# Patient Record
Sex: Female | Born: 1990 | Race: White | Hispanic: No | Marital: Married | Smoking: Never smoker
Health system: Southern US, Community
[De-identification: ages and names within clinical notes are randomized; demographics above are authoritative.]

## PROBLEM LIST (undated history)

## (undated) DIAGNOSIS — O24419 Gestational diabetes mellitus in pregnancy, unspecified control: Secondary | ICD-10-CM

## (undated) HISTORY — PX: NO PAST SURGERIES: SHX2092

## (undated) HISTORY — DX: Gestational diabetes mellitus in pregnancy, unspecified control: O24.419

---

## 2015-10-29 DIAGNOSIS — F900 Attention-deficit hyperactivity disorder, predominantly inattentive type: Secondary | ICD-10-CM | POA: Insufficient documentation

## 2016-01-24 DIAGNOSIS — Z79899 Other long term (current) drug therapy: Secondary | ICD-10-CM | POA: Insufficient documentation

## 2019-11-10 NOTE — L&D Delivery Note (Signed)
Delivery Note At 9:56 AM a viable female was delivered via Vaginal, Spontaneous (Presentation: LOA).  APGAR: 9, 10; weight pending.   Placenta status: Spontaneous, Intact.  Cord: 3 vessels with the following complications: None.  Cord pH: n/a  Anesthesia: Epidural Episiotomy: None Lacerations: Bilateral Periurethral Suture Repair: 3.0 vicryl Est. Blood Loss (mL):  200  Mom to postpartum.  Baby to Couplet care / Skin to Skin.  Mitchel Honour 09/19/2020, 10:17 AM

## 2020-02-23 LAB — OB RESULTS CONSOLE HEPATITIS B SURFACE ANTIGEN: Hepatitis B Surface Ag: NEGATIVE

## 2020-02-23 LAB — OB RESULTS CONSOLE ABO/RH: RH Type: POSITIVE

## 2020-02-23 LAB — OB RESULTS CONSOLE ANTIBODY SCREEN: Antibody Screen: NEGATIVE

## 2020-02-23 LAB — OB RESULTS CONSOLE RUBELLA ANTIBODY, IGM: Rubella: IMMUNE

## 2020-02-23 LAB — OB RESULTS CONSOLE GC/CHLAMYDIA
Chlamydia: NEGATIVE
Gonorrhea: NEGATIVE

## 2020-02-23 LAB — OB RESULTS CONSOLE HIV ANTIBODY (ROUTINE TESTING): HIV: NONREACTIVE

## 2020-02-23 LAB — OB RESULTS CONSOLE RPR: RPR: NONREACTIVE

## 2020-03-05 ENCOUNTER — Inpatient Hospital Stay (HOSPITAL_COMMUNITY): Admission: AD | Admit: 2020-03-05 | Payer: Self-pay | Source: Home / Self Care | Admitting: Obstetrics and Gynecology

## 2020-07-08 ENCOUNTER — Ambulatory Visit: Payer: Self-pay

## 2020-07-11 ENCOUNTER — Inpatient Hospital Stay (HOSPITAL_COMMUNITY)
Admission: AD | Admit: 2020-07-11 | Discharge: 2020-07-12 | Disposition: A | Discharge status: Home / Self Care | Payer: BC Managed Care – PPO | Attending: Obstetrics and Gynecology | Admitting: Obstetrics and Gynecology

## 2020-07-11 ENCOUNTER — Inpatient Hospital Stay (HOSPITAL_COMMUNITY): Payer: BC Managed Care – PPO

## 2020-07-11 ENCOUNTER — Other Ambulatory Visit: Payer: Self-pay

## 2020-07-11 ENCOUNTER — Encounter (HOSPITAL_COMMUNITY): Payer: Self-pay | Admitting: Obstetrics and Gynecology

## 2020-07-11 DIAGNOSIS — R0981 Nasal congestion: Secondary | ICD-10-CM

## 2020-07-11 DIAGNOSIS — O98513 Other viral diseases complicating pregnancy, third trimester: Secondary | ICD-10-CM | POA: Insufficient documentation

## 2020-07-11 DIAGNOSIS — Z880 Allergy status to penicillin: Secondary | ICD-10-CM | POA: Diagnosis not present

## 2020-07-11 DIAGNOSIS — O26893 Other specified pregnancy related conditions, third trimester: Secondary | ICD-10-CM | POA: Diagnosis not present

## 2020-07-11 DIAGNOSIS — O99891 Other specified diseases and conditions complicating pregnancy: Secondary | ICD-10-CM | POA: Diagnosis not present

## 2020-07-11 DIAGNOSIS — U071 COVID-19: Secondary | ICD-10-CM | POA: Diagnosis present

## 2020-07-11 DIAGNOSIS — R0789 Other chest pain: Secondary | ICD-10-CM | POA: Insufficient documentation

## 2020-07-11 DIAGNOSIS — Z3A29 29 weeks gestation of pregnancy: Secondary | ICD-10-CM | POA: Insufficient documentation

## 2020-07-11 DIAGNOSIS — R0602 Shortness of breath: Secondary | ICD-10-CM | POA: Diagnosis not present

## 2020-07-11 MED ORDER — LACTATED RINGERS IV BOLUS
1000.0000 mL | Freq: Once | INTRAVENOUS | Status: AC
  Administered 2020-07-11: 1000 mL via INTRAVENOUS

## 2020-07-11 NOTE — MAU Note (Signed)
.  Natalie Barnes is a 29 y.o. at Unknown here in MAU reporting: she called her on call provider after finding out she tested positive for COVID. Pt reports the nurse instructed her to go to the ED. Pt is experiencing chest tightness, SOB, and beginning to feel congested behind her sinuses.  FHT: 145 external

## 2020-07-11 NOTE — MAU Note (Signed)
PT states she was having some chest pressure and took covid test which was positive. She called her doctor and was told to come in for chest xray. States otherwise she is fine

## 2020-07-12 ENCOUNTER — Encounter: Payer: Self-pay | Admitting: Registered"

## 2020-07-12 ENCOUNTER — Ambulatory Visit: Payer: Self-pay | Admitting: Registered"

## 2020-07-12 ENCOUNTER — Other Ambulatory Visit (HOSPITAL_COMMUNITY): Payer: Self-pay | Admitting: Nurse Practitioner

## 2020-07-12 ENCOUNTER — Encounter: Payer: BC Managed Care – PPO | Attending: Obstetrics and Gynecology | Admitting: Registered"

## 2020-07-12 DIAGNOSIS — O99891 Other specified diseases and conditions complicating pregnancy: Secondary | ICD-10-CM

## 2020-07-12 DIAGNOSIS — U071 COVID-19: Secondary | ICD-10-CM

## 2020-07-12 DIAGNOSIS — R0602 Shortness of breath: Secondary | ICD-10-CM

## 2020-07-12 DIAGNOSIS — O24419 Gestational diabetes mellitus in pregnancy, unspecified control: Secondary | ICD-10-CM

## 2020-07-12 DIAGNOSIS — R0981 Nasal congestion: Secondary | ICD-10-CM

## 2020-07-12 DIAGNOSIS — Z3A29 29 weeks gestation of pregnancy: Secondary | ICD-10-CM

## 2020-07-12 DIAGNOSIS — R0789 Other chest pain: Secondary | ICD-10-CM

## 2020-07-12 DIAGNOSIS — O98513 Other viral diseases complicating pregnancy, third trimester: Secondary | ICD-10-CM | POA: Diagnosis present

## 2020-07-12 LAB — COMPREHENSIVE METABOLIC PANEL
ALT: 17 U/L (ref 0–44)
AST: 16 U/L (ref 15–41)
Albumin: 3.3 g/dL — ABNORMAL LOW (ref 3.5–5.0)
Alkaline Phosphatase: 61 U/L (ref 38–126)
Anion gap: 10 (ref 5–15)
BUN: 9 mg/dL (ref 6–20)
CO2: 21 mmol/L — ABNORMAL LOW (ref 22–32)
Calcium: 9.3 mg/dL (ref 8.9–10.3)
Chloride: 105 mmol/L (ref 98–111)
Creatinine, Ser: 0.54 mg/dL (ref 0.44–1.00)
GFR calc Af Amer: >60 mL/min (ref >60)
GFR calc non Af Amer: >60 mL/min (ref >60)
Glucose, Bld: 82 mg/dL (ref 70–99)
Potassium: 3.4 mmol/L — ABNORMAL LOW (ref 3.5–5.1)
Sodium: 136 mmol/L (ref 135–145)
Total Bilirubin: 0.6 mg/dL (ref 0.3–1.2)
Total Protein: 6.4 g/dL — ABNORMAL LOW (ref 6.5–8.1)

## 2020-07-12 LAB — CBC WITH DIFFERENTIAL/PLATELET
Abs Immature Granulocytes: 0.06 10*3/uL (ref 0.00–0.07)
Basophils Absolute: 0 10*3/uL (ref 0.0–0.1)
Basophils Relative: 0 %
Eosinophils Absolute: 0.1 10*3/uL (ref 0.0–0.5)
Eosinophils Relative: 1 %
HCT: 37.2 % (ref 36.0–46.0)
Hemoglobin: 12.2 g/dL (ref 12.0–15.0)
Immature Granulocytes: 1 %
Lymphocytes Relative: 23 %
Lymphs Abs: 2.1 10*3/uL (ref 0.7–4.0)
MCH: 27.4 pg (ref 26.0–34.0)
MCHC: 32.8 g/dL (ref 30.0–36.0)
MCV: 83.4 fL (ref 80.0–100.0)
Monocytes Absolute: 0.7 10*3/uL (ref 0.1–1.0)
Monocytes Relative: 7 %
Neutro Abs: 6.5 10*3/uL (ref 1.7–7.7)
Neutrophils Relative %: 68 %
Platelets: 303 10*3/uL (ref 150–400)
RBC: 4.46 MIL/uL (ref 3.87–5.11)
RDW: 12.9 % (ref 11.5–15.5)
WBC: 9.4 10*3/uL (ref 4.0–10.5)
nRBC: 0 % (ref 0.0–0.2)

## 2020-07-12 LAB — TROPONIN I (HIGH SENSITIVITY): Troponin I (High Sensitivity): 3 ng/L (ref <18)

## 2020-07-12 LAB — BRAIN NATRIURETIC PEPTIDE: B Natriuretic Peptide: 33.2 pg/mL (ref 0.0–100.0)

## 2020-07-12 MED ORDER — GUAIFENESIN ER 600 MG PO TB12: 600.0000 mg | ORAL_TABLET | Freq: Two times a day (BID) | ORAL | 1 refills | Status: DC

## 2020-07-12 NOTE — Progress Notes (Signed)
Patient was seen on 07/12/20 for Gestational Diabetes self-management education at the [facility] [place].  The following learning objectives were met by the patient during this course:   States the definition of Gestational Diabetes  States why dietary management is important in controlling blood glucose  Describes the effects each nutrient has on blood glucose levels  Demonstrates ability to create a balanced meal plan  Demonstrates carbohydrate counting   States when to check blood glucose levels  Voiced proper blood glucose monitoring techniques, reviewed video instructions  States the effect of stress and exercise on blood glucose levels  States the importance of limiting caffeine and abstaining from alcohol and smoking  Blood glucose monitor given: none - virtual visit   Patient instructed to monitor glucose levels: FBS: 60 - <95; 1 hour: <140; 2 hour: <120  Patient received handouts:  Nutrition Diabetes and Pregnancy, including carb counting list  Patient will be seen for follow-up as needed.

## 2020-07-12 NOTE — Discharge Instructions (Signed)
10 Things You Can Do to Manage Your COVID-19 Symptoms at Home If you have possible or confirmed COVID-19: 1. Stay home from work and school. And stay away from other public places. If you must go out, avoid using any kind of public transportation, ridesharing, or taxis. 2. Monitor your symptoms carefully. If your symptoms get worse, call your healthcare provider immediately. 3. Get rest and stay hydrated. 4. If you have a medical appointment, call the healthcare provider ahead of time and tell them that you have or may have COVID-19. 5. For medical emergencies, call 911 and notify the dispatch personnel that you have or may have COVID-19. 6. Cover your cough and sneezes with a tissue or use the inside of your elbow. 7. Wash your hands often with soap and water for at least 20 seconds or clean your hands with an alcohol-based hand sanitizer that contains at least 60% alcohol. 8. As much as possible, stay in a specific room and away from other people in your home. Also, you should use a separate bathroom, if available. If you need to be around other people in or outside of the home, wear a mask. 9. Avoid sharing personal items with other people in your household, like dishes, towels, and bedding. 10. Clean all surfaces that are touched often, like counters, tabletops, and doorknobs. Use household cleaning sprays or wipes according to the label instructions. [email or web address] 05/10/2019 This information is not intended to replace advice given to you by your health care provider. Make sure you discuss any questions you have with your health care provider. Document Revised: 10/12/2019 Document Reviewed: 10/12/2019 Elsevier Patient Education  [place].   COVID-19 Frequently Asked Questions COVID-19 (coronavirus disease) is an infection that is caused by a large family of viruses. Some viruses cause illness in people and others cause illness in animals like camels, cats, and bats. In some  cases, the viruses that cause illness in animals can spread to humans. Where did the coronavirus come from? In December 2019, [place] told the [facility]) of several cases of lung disease (human respiratory illness). These cases were linked to an open seafood and livestock market in the city of [place]. The link to the seafood and livestock market suggests that the virus may have spread from animals to humans. However, since that first outbreak in December, the virus has also been shown to spread from person to person. What is the name of the disease and the virus? Disease name Early on, this disease was called novel coronavirus. This is because scientists determined that the disease was caused by a new (novel) respiratory virus. The World Health Organization [facility] has now named the disease COVID-19, or coronavirus disease. Virus name The virus that causes the disease is called severe acute respiratory syndrome coronavirus 2 (SARS-CoV-2). More information on disease and virus naming World Health Organization [facility]): www.who.int/emergencies/diseases/novel-coronavirus-2019/technical-guidance/naming-the-coronavirus-disease-(covid-2019)-and-the-virus-that-causes-it Who is at risk for complications from coronavirus disease? Some people may be at higher risk for complications from coronavirus disease. This includes older adults and people who have chronic diseases, such as heart disease, diabetes, and lung disease. If you are at higher risk for complications, take these extra precautions:  Stay home as much as possible.  Avoid social gatherings and travel.  Avoid close contact with others. Stay at least 6 ft (2 m) away from others, if possible.  Wash your hands often with soap and water for at least 20 seconds.  Avoid touching your face, mouth, nose, or eyes.  Keep supplies on hand at home, such as food, medicine, and cleaning supplies.  If you must go out in public, wear a cloth  face covering or face mask. Make sure your mask covers your nose and mouth. How does coronavirus disease spread? The virus that causes coronavirus disease spreads easily from person to person (is contagious). You may catch the virus by:  Breathing in droplets from an infected person. Droplets can be spread by a person breathing, speaking, singing, coughing, or sneezing.  Touching something, like a table or a doorknob, that was exposed to the virus (contaminated) and then touching your mouth, nose, or eyes. Can I get the virus from touching surfaces or objects? There is still a lot that we do not know about the virus that causes coronavirus disease. Scientists are basing a lot of information on what they know about similar viruses, such as:  Viruses cannot generally survive on surfaces for long. They need a human body (host) to survive.  It is more likely that the virus is spread by close contact with people who are sick (direct contact), such as through: ? Shaking hands or hugging. ? Breathing in respiratory droplets that travel through the air. Droplets can be spread by a person breathing, speaking, singing, coughing, or sneezing.  It is less likely that the virus is spread when a person touches a surface or object that has the virus on it (indirect contact). The virus may be able to enter the body if the person touches a surface or object and then touches his or her face, eyes, nose, or mouth. Can a person spread the virus without having symptoms of the disease? It may be possible for the virus to spread before a person has symptoms of the disease, but this is most likely not the main way the virus is spreading. It is more likely for the virus to spread by being in close contact with people who are sick and breathing in the respiratory droplets spread by a person breathing, speaking, singing, coughing, or sneezing. What are the symptoms of coronavirus disease? Symptoms vary from person to  person and can range from mild to severe. Symptoms may include:  Fever or chills.  Cough.  Difficulty breathing or feeling short of breath.  Headaches, body aches, or muscle aches.  Runny or stuffy (congested) nose.  Sore throat.  New loss of taste or smell.  Nausea, vomiting, or diarrhea. These symptoms can appear anywhere from 2 to 14 days after you have been exposed to the virus. Some people may not have any symptoms. If you develop symptoms, call your health care provider. People with severe symptoms may need hospital care. Should I be tested for this virus? Your health care provider will decide whether to test you based on your symptoms, history of exposure, and your risk factors. How does a health care provider test for this virus? Health care providers will collect samples to send for testing. Samples may include:  Taking a swab of fluid from the back of your nose and throat, your nose, or your throat.  Taking fluid from the lungs by having you cough up mucus (sputum) into a sterile cup.  Taking a blood sample. Is there a treatment or vaccine for this virus? Currently, there is no vaccine to prevent coronavirus disease. Also, there are no medicines like antibiotics or antivirals to treat the virus. A person who becomes sick is given supportive care, which means rest and fluids. A person may also  relieve his or her symptoms by using over-the-counter medicines that treat sneezing, coughing, and runny nose. These are the same medicines that a person takes for the common cold. If you develop symptoms, call your health care provider. People with severe symptoms may need hospital care. What can I do to protect myself and my family from this virus?     You can protect yourself and your family by taking the same actions that you would take to prevent the spread of other viruses. Take the following actions:  Wash your hands often with soap and water for at least 20 seconds. If soap  and water are not available, use alcohol-based hand sanitizer.  Avoid touching your face, mouth, nose, or eyes.  Cough or sneeze into a tissue, sleeve, or elbow. Do not cough or sneeze into your hand or the air. ? If you cough or sneeze into a tissue, throw it away immediately and wash your hands.  Disinfect objects and surfaces that you frequently touch every day.  Stay away from people who are sick.  Avoid going out in public, follow guidance from your state and local health authorities.  Avoid crowded indoor spaces. Stay at least 6 ft (2 m) away from others.  If you must go out in public, wear a cloth face covering or face mask. Make sure your mask covers your nose and mouth.  Stay home if you are sick, except to get medical care. Call your health care provider before you get medical care. Your health care provider will tell you how long to stay home.  Make sure your vaccines are up to date. Ask your health care provider what vaccines you need. What should I do if I need to travel? Follow travel recommendations from your local health authority, the CDC, and WHO. Travel information and advice  Centers for Disease Control and Prevention (CDC): www.cdc.gov/coronavirus/2019-ncov/travelers/index.html  World Health Organization (WHO): www.who.int/emergencies/diseases/novel-coronavirus-2019/travel-advice Know the risks and take action to protect your health  You are at higher risk of getting coronavirus disease if you are traveling to areas with an outbreak or if you are exposed to travelers from areas with an outbreak.  Wash your hands often and practice good hygiene to lower the risk of catching or spreading the virus. What should I do if I am sick? General instructions to stop the spread of infection  Wash your hands often with soap and water for at least 20 seconds. If soap and water are not available, use alcohol-based hand sanitizer.  Cough or sneeze into a tissue, sleeve, or  elbow. Do not cough or sneeze into your hand or the air.  If you cough or sneeze into a tissue, throw it away immediately and wash your hands.  Stay home unless you must get medical care. Call your health care provider or local health authority before you get medical care.  Avoid public areas. Do not take public transportation, if possible.  If you can, wear a mask if you must go out of the house or if you are in close contact with someone who is not sick. Make sure your mask covers your nose and mouth. Keep your home clean  Disinfect objects and surfaces that are frequently touched every day. This may include: ? Counters and tables. ? Doorknobs and light switches. ? Sinks and faucets. ? Electronics such as phones, remote controls, keyboards, computers, and tablets.  Wash dishes in hot, soapy water or use a dishwasher. Air-dry your dishes.  Wash laundry in   hot water. Prevent infecting other household members  Let healthy household members care for children and pets, if possible. If you have to care for children or pets, wash your hands often and wear a mask.  Sleep in a different bedroom or bed, if possible.  Do not share personal items, such as razors, toothbrushes, deodorant, combs, brushes, towels, and washcloths. Where to find more information Centers for Disease Control and Prevention (CDC)  Information and news updates: CardRetirement.cz World Health Organization [facility])  Information and news updates: AffordableSalon.es  Coronavirus health topic: https://thompson-craig.com/  Questions and answers on COVID-19: kruiseway.com  Global tracker: who.sprinklr.com American Academy of Pediatrics (AAP)  Information for families: www.healthychildren.org/English/health-issues/conditions/chest-lungs/Pages/2019-Novel-Coronavirus.aspx The coronavirus situation is changing rapidly. Check  your local health authority website or the CDC and [facility] websites for updates and news. When should I contact a health care provider?  Contact your health care provider if you have symptoms of an infection, such as fever or cough, and you: ? Have been near anyone who is known to have coronavirus disease. ? Have come into contact with a person who is suspected to have coronavirus disease. ? Have traveled to an area where there is an outbreak of COVID-19. When should I get emergency medical care?  Get help right away by calling your local emergency services (911 in the [place] if you have: ? Trouble breathing. ? Pain or pressure in your chest. ? Confusion. ? Blue-tinged lips and fingernails. ? Difficulty waking from sleep. ? Symptoms that get worse. Let the emergency medical personnel know if you think you have coronavirus disease. Summary  A new respiratory virus is spreading from person to person and causing COVID-19 (coronavirus disease).  The virus that causes COVID-19 appears to spread easily. It spreads from one person to another through droplets from breathing, speaking, singing, coughing, or sneezing.  Older adults and those with chronic diseases are at higher risk of disease. If you are at higher risk for complications, take extra precautions.  There is currently no vaccine to prevent coronavirus disease. There are no medicines, such as antibiotics or antivirals, to treat the virus.  You can protect yourself and your family by washing your hands often, avoiding touching your face, and covering your coughs and sneezes. This information is not intended to replace advice given to you by your health care provider. Make sure you discuss any questions you have with your health care provider. Document Revised: 08/25/2019 Document Reviewed: 02/21/2019 Elsevier Patient Education  2020 Elsevier Inc.  Pregnancy and COVID-19 Coronavirus disease, also called COVID-19, is an infection of the  lungs and airways (respiratory tract). It is unclear at this time if pregnancy makes it more likely for you to get COVID-19, or what effects the infection may have on your unborn baby. However, pregnancy causes changes to your heart, lungs, and your body's disease-fighting system (immune system). Some of these changes make it more likely for you to get sick and have more serious illness. Therefore, it is important for you to take precautions in order to protect yourself and your unborn baby. There have been studies showing that obesity and diabetes may put you at higher risk for serious illness. If you are pregnant and are obese or have diabetes, you should take extra precautions to protect yourself from the virus. Work with your health care team to develop a plan to protect yourself from all infections, including COVID-19. This is one way for you to stay healthy during your pregnancy and to keep your  baby healthy as well. How does this affect me? If you get COVID-19, there is a risk that you may:  Get a respiratory illness that can lead to pneumonia.  Give birth to your baby before 37 weeks of pregnancy (premature birth). If you have or may have COVID-19, your health care provider may recommend special precautions around your pregnancy. This may affect how you:  Receive care before delivery (prenatal care). How you visit your health care provider may change. Tests and scans may need to be performed differently.  Receive care during labor and delivery. This may affect your birth plan, including who may be with you during labor and delivery.  Receive care after you deliver your baby (postpartum care). You may stay longer in the hospital and in a special room.  Feed your baby after he or she is born. Pregnancy can be an especially stressful time because of the changes in your body and the preparation involved in becoming a parent. In addition, you may be feeling especially fearful, anxious, or  stressed because of COVID-19 and how it is affecting you. How does this affect my baby? It is not known whether a mother will transmit the virus to her unborn baby. There is a risk that if you get COVID-19:  The virus that causes COVID-19 can pass to your baby.  You may have premature birth. Your baby may require more medical care if this happens. What can I do to lower my risk?  There is no vaccine to help prevent COVID-19. However, there are actions that you can take to protect yourself and others from this virus. Cleaning and personal hygiene  Wash your hands often with soap and water for at least 20 seconds. If soap and water are not available, use alcohol-based hand sanitizer.  Avoid touching your mouth, face, eyes, or nose.  Clean and disinfect objects and surfaces that are frequently touched every day. These may include: ? Counters and tables. ? Doorknobs and light switches. ? Sinks and faucets. ? Electronics such as phones, remote controls, keyboards, computers, and tablets. Stay away from others  Stay away from people who are sick, if possible.  Avoid social gatherings and travel.  Stay home as much as possible. Follow these instructions: Breastfeeding It is not known if the virus that causes COVID-19 can pass through breast milk to your baby. You should make a plan for feeding your infant with your family and your health care team. If you have or may have COVID-19, your health care provider may recommend that you take precautions while breastfeeding, such as:  Washing your hands before feeding your baby.  Wearing a mask while feeding your baby.  Pumping or expressing breast milk to feed to your baby. If possible, ask someone in your household who is not sick to feed your baby the expressed breast milk. ? Wash your hands before touching pump parts. ? Wash and disinfect all pump parts after expressing milk. Follow the manufacturer's instructions to clean and disinfect  all pump parts. General instructions  If you think you have a COVID-19 infection, contact your health care provider right away. Tell your health care provider that you think you may have a COVID-19 infection.  Follow your health care provider's instructions on taking medicines. Some medicines may be unsafe to take during pregnancy.  Cover your mouth and nose by wearing a mask or other cloth covering over your face when you go out in public.  Find ways to manage stress.  These may include: ? Using relaxation techniques like meditation and deep breathing. ? Getting regular exercise. Most women can continue their usual exercise routine during pregnancy. Ask your health care provider what activities are safe for you. ? Seeking support from family, friends, or spiritual resources. If you cannot be together in person, you can still connect by phone calls, texts, video calls, or online messaging. ? Spending time doing relaxing activities that you enjoy, like listening to music or reading a good book.  Ask for help if you have counseling or nutritional needs during pregnancy. Your health care provider can offer advice or refer you to resources or specialists who can help you with various needs.  Keep all follow-up visits as told by your health care provider. This is important. Where to find more information Centers for Disease Control and Prevention (CDC): AffordableShare.com.brwww.cdc.gov/coronavirus/2019-ncov/ World Health Organization [facility](WHO): PokerPortraits.eswww.who.int/news-room/q-a-detail/q-a-on-covid-19-pregnancy-childbirth-and-breastfeeding [employer] (ACOG): BuyDucts.dkwww.acog.org/patient-resources/faqs/pregnancy/coronavirus-pregnancy-and-breastfeeding Questions to ask your health care team  What should I do if I have COVID-19 symptoms?  How will COVID-19 affect my prenatal care visits, tests and scans, labor and delivery, and postpartum care?  Should I plan to breastfeed my baby?  Where can I  find mental health resources?  Where can I find support if I have financial concerns? Contact a health care provider if:  You have signs and symptoms of infection, including a fever or cough. Tell your health care team that you think you may have a COVID-19 infection.  You have strong emotions, such as sadness or anxiety.  You feel unsafe in your home and need help finding a safe place to live.  You have bloody or watery vaginal discharge or vaginal bleeding. Get help right away if:  You have signs or symptoms of labor before 37 weeks of pregnancy. These include: ? Contractions that are 5 minutes or less apart, or that increase in frequency, intensity, or length. ? Sudden, sharp pain in the abdomen or in the lower back. ? A gush or trickle of fluid from your vagina.  You have signs of more serious illness such as: ? You have difficulty breathing. ? You have chest pain. ? You have a fever greater than 102F (39C) or higher that does not go away. ? You cannot drink fluids without vomiting. ? You feel extremely weak or you faint. These symptoms may represent a serious problem that is an emergency. Do not wait to see if the symptoms will go away. Get medical help right away. Call your local emergency services (911 in the [place] Do not drive yourself to the hospital. Summary  Coronavirus disease, also called COVID-19, is an infection of the lungs and airways (respiratory tract). It is unclear at this time if pregnancy makes you more susceptible to COVID-19 and what effects it may have on unborn babies.  It is important to take precautions to protect yourself and your developing baby. This includes washing your hands often, avoiding touching your mouth, face, eyes, or nose, avoiding social gatherings and travel, and staying away from people who are sick.  If you think you have a COVID-19 infection, contact your health care provider right away. Tell your health care provider that you think  you may have a COVID-19 infection.  If you have or may have COVID-19, your health care provider may recommend special precautions during your pregnancy, labor and delivery, and after your baby is born. This information is not intended to replace advice given to you by your health care  provider. Make sure you discuss any questions you have with your health care provider. Document Revised: 08/18/2019 Document Reviewed: 02/21/2019 Elsevier Patient Education  2020 [employer].

## 2020-07-12 NOTE — MAU Provider Note (Signed)
Chief Complaint:  Chest Pain (tightness) and Shortness of Breath   First Provider Initiated Contact with Patient 07/11/20 at 2330     HPI: Kaytlin Mom is a 29 y.o. G2P0 at 60w2dwho presents to maternity admissions reporting testing positive for Covid today (states urgent care referred her to a mobile testing unit).  States has some pressure/heaviness in chest which is what prompted her to get tested.  No known exposure   Husband has been vaccinated, pt was scheduled to go mid-September.  Tells RN she is short of breath, but denies to me.  States has some postnasal drainage today (new).  No contractions or pregnancy complaints. . She reports good fetal movement, denies LOF, vaginal bleeding, vaginal itching/burning, urinary symptoms, h/a, dizziness, n/v, diarrhea, constipation or fever/chills.  .  Chest Pain  This is a new problem. The current episode started today. The problem occurs constantly. The problem has been unchanged. The pain is present in the substernal region. The pain is mild. The quality of the pain is described as dull and pressure. The pain does not radiate. Associated symptoms include shortness of breath (only with exertion). Pertinent negatives include no abdominal pain, back pain, cough, dizziness, exertional chest pressure, fever, headaches, nausea, orthopnea, palpitations or sputum production. The pain is aggravated by nothing. She has tried nothing for the symptoms.  Shortness of Breath This is a new (Reported to RN , denied to me except mild SOB with exertion) problem. The current episode started today. The problem occurs intermittently. The problem has been unchanged. Associated symptoms include chest pain. Pertinent negatives include no abdominal pain, fever, headaches, orthopnea or sputum production. Nothing aggravates the symptoms. She has tried nothing for the symptoms.    RN Note: PT states she was having some chest pressure and took covid test which was positive. She called  her doctor and was told to come in for chest xray. States otherwise she is fine  Past Medical History: History reviewed. No pertinent past medical history.  Past obstetric history: OB History  Gravida Para Term Preterm AB Living  2            SAB TAB Ectopic Multiple Live Births               # Outcome Date GA Lbr Len/2nd Weight Sex Delivery Anes PTL Lv  2 Current           1 Gravida             Past Surgical History: History reviewed. No pertinent surgical history.  Family History: Family History  Problem Relation Age of Onset  . Diabetes Mother     Social History: Social History   Tobacco Use  . Smoking status: Never Smoker  . Smokeless tobacco: Never Used  Substance Use Topics  . Alcohol use: Never  . Drug use: Never    Allergies:  Allergies  Allergen Reactions  . Penicillins Hives, Diarrhea, Nausea Only and Rash    Meds:  Medications Prior to Admission  Medication Sig Dispense Refill Last Dose  . Prenatal Vit-Fe Fumarate-FA (PRENATAL MULTIVITAMIN) TABS tablet Take 1 tablet by mouth daily at 12 noon.   07/11/2020 at Unknown time    I have reviewed patient's Past Medical Hx, Surgical Hx, Family Hx, Social Hx, medications and allergies.   ROS:  Review of Systems  Constitutional: Negative for fever.  Respiratory: Positive for shortness of breath (only with exertion). Negative for cough and sputum production.   Cardiovascular: Positive for chest  pain. Negative for palpitations and orthopnea.  Gastrointestinal: Negative for abdominal pain and nausea.  Musculoskeletal: Negative for back pain.  Neurological: Negative for dizziness and headaches.   Other systems negative  Physical Exam   Patient Vitals for the past 24 hrs:  BP Pulse Resp SpO2  07/11/20 2340 -- -- -- 100 %  07/11/20 2339 -- -- -- 100 %  07/11/20 2338 -- -- -- 100 %  07/11/20 2337 -- -- -- 100 %  07/11/20 2336 -- -- -- 100 %  07/11/20 2335 -- -- -- 100 %  07/11/20 2334 -- -- -- 100 %   07/11/20 2333 -- -- -- 100 %  07/11/20 2236 126/85 76 17 99 %  Gotten out of bed, paced around room for 9 minutes, oxygen saturation remained 100% throughout  Constitutional: Well-developed, well-nourished female in no acute distress.  Cardiovascular: normal rate and rhythm Respiratory: normal effort, clear to auscultation bilaterally, questionable mild rhonchi scattered (difficult to hear due to airflow machine) GI: Abd soft, non-tender, gravid appropriate for gestational age.   No rebound or guarding. MS: Extremities nontender, no edema, normal ROM Neurologic: Alert and oriented x 4.  GU: Neg CVAT.  PELVIC EXAM: deferred  FHT:  Baseline 140 , moderate variability, 10-12 beat accelerations present, no decelerations Contractions:   Rare  Questionable uterine irritability, intermittent   Labs: Results for orders placed or performed during the hospital encounter of 07/11/20 (from the past 24 hour(s))  Brain natriuretic peptide     Status: None   Collection Time: 07/11/20 10:35 PM  Result Value Ref Range   B Natriuretic Peptide 33.2 0.0 - 100.0 pg/mL  CBC with Differential/Platelet     Status: None   Collection Time: 07/11/20 11:09 PM  Result Value Ref Range   WBC 9.4 4.0 - 10.5 K/uL   RBC 4.46 3.87 - 5.11 MIL/uL   Hemoglobin 12.2 12.0 - 15.0 g/dL   HCT 58.8 36 - 46 %   MCV 83.4 80.0 - 100.0 fL   MCH 27.4 26.0 - 34.0 pg   MCHC 32.8 30.0 - 36.0 g/dL   RDW 50.2 77.4 - 12.8 %   Platelets 303 150 - 400 K/uL   nRBC 0.00 0.0 - 0.2 %   Neutrophils Relative % 68 %   Neutro Abs 6.5 1.7 - 7.7 K/uL   Lymphocytes Relative 23 %   Lymphs Abs 2.1 0.7 - 4.0 K/uL   Monocytes Relative 7 %   Monocytes Absolute 0.7 0 - 1 K/uL   Eosinophils Relative 1 %   Eosinophils Absolute 0.1 0 - 0 K/uL   Basophils Relative 0 %   Basophils Absolute 0.0 0 - 0 K/uL   Immature Granulocytes 1 %   Abs Immature Granulocytes 0.06 0.00 - 0.07 K/uL  Comprehensive metabolic panel     Status: Abnormal    Collection Time: 07/11/20 11:09 PM  Result Value Ref Range   Sodium 136 135 - 145 mmol/L   Potassium 3.4 (L) 3.5 - 5.1 mmol/L   Chloride 105 98 - 111 mmol/L   CO2 21 (L) 22 - 32 mmol/L   Glucose, Bld 82 70 - 99 mg/dL   BUN 9 6 - 20 mg/dL   Creatinine, Ser 7.86 0.44 - 1.00 mg/dL   Calcium 9.3 8.9 - 76.7 mg/dL   Total Protein 6.4 (L) 6.5 - 8.1 g/dL   Albumin 3.3 (L) 3.5 - 5.0 g/dL   AST 16 15 - 41 U/L   ALT 17 0 -  44 U/L   Alkaline Phosphatase 61 38 - 126 U/L   Total Bilirubin 0.6 0.3 - 1.2 mg/dL   GFR calc non Af Amer >60 >60 mL/min   GFR calc Af Amer >60 >60 mL/min   Anion gap 10 5 - 15  Troponin I (High Sensitivity)     Status: None   Collection Time: 07/11/20 11:09 PM  Result Value Ref Range   Troponin I (High Sensitivity) 3 <18 ng/L    Imaging:  DG Chest Portable 1 View  Result Date: 07/12/2020 CLINICAL DATA:  Central chest pressure, short of breath, COVID-19 positive, pregnant EXAM: PORTABLE CHEST 1 VIEW COMPARISON:  None. FINDINGS: The heart size and mediastinal contours are within normal limits. Both lungs are clear. The visualized skeletal structures are unremarkable. IMPRESSION: No active disease. Electronically Signed   By: Sharlet Salina M.D.   On: 07/12/2020 00:10   MAU Course/MDM: I have ordered labs and reviewed results.   IV started and fluids given for hydration. EKG showed Normal sinus rhythm NST reviewed and is reassuring for gestational age.  Consult Dr Macon Large with presentation, exam findings and test results.  Treatments in MAU included IV hydration.    Consulted [facility] hospitalists who deferred advice due to pregnancy.  Then I consulted Dr Eudelia Bunch in Main ED who stated it will likely be beneficial for her to receive Monoclonal antibody infusions.  I sent a secure chat message to the [facility] group who will call her to make arrangements  Assessment: Single IUP at [redacted]w[redacted]d Covid + No sign of pneumonia at present Chest heaviness, neg workup for PE and  ACS. Nasal congestion  Plan: Discharge home Discussed results are reassuring Discussed aftercare and precautions for return Rx Mucinex for keeping secretions thin Recommend hourly Cough/Deep Breathing Recommend walk around room q1-2 hours.  Preterm Labor precautions and fetal kick counts Follow up with call to office tomorrow to arrange video visits [facility] group to call patient Encouraged to return here or to other Urgent Care/ED if she develops worsening of symptoms, increase in pain, fever, or other concerning symptoms.   Pt stable at time of discharge.  Wynelle Bourgeois CNM, MSN Certified Nurse-Midwife 07/12/2020 12:00 AM

## 2020-07-12 NOTE — Progress Notes (Signed)
I connected by phone with Natalie Barnes on 07/12/2020 at 7:50 AM to discuss the potential use of an new treatment for mild to moderate COVID-19 viral infection in non-hospitalized patients.  This patient is a 29 y.o. female that meets the FDA criteria for Emergency Use Authorization of casirivimab\imdevimab.  Has a (+) direct SARS-CoV-2 viral test result  Has mild or moderate COVID-19   Is ? 30 years of age and weighs ? 40 kg  Is NOT hospitalized due to COVID-19  Is NOT requiring oxygen therapy or requiring an increase in baseline oxygen flow rate due to COVID-19  Is within 10 days of symptom onset  Has at least one of the high risk factor(s) for progression to severe COVID-19 and/or hospitalization as defined in EUA.  Specific high risk criteria : Pregnancy   Sx onset 9/2. Patient is unvaccinated.    I have spoken and communicated the following to the patient or parent/caregiver:  1. FDA has authorized the emergency use of casirivimab\imdevimab for the treatment of mild to moderate COVID-19 in adults and pediatric patients with positive results of direct SARS-CoV-2 viral testing who are 39 years of age and older weighing at least 40 kg, and who are at high risk for progressing to severe COVID-19 and/or hospitalization.  2. The significant known and potential risks and benefits of casirivimab\imdevimab, and the extent to which such potential risks and benefits are unknown.  3. Information on available alternative treatments and the risks and benefits of those alternatives, including clinical trials.  4. Patients treated with casirivimab\imdevimab should continue to self-isolate and use infection control measures (e.g., wear mask, isolate, social distance, avoid sharing personal items, clean and disinfect "high touch" surfaces, and frequent handwashing) according to CDC guidelines.   5. The patient or parent/caregiver has the option to accept or refuse casirivimab\imdevimab .  After  reviewing this information with the patient, The patient agreed to proceed with receiving casirivimab\imdevimab infusion and will be provided a copy of the Fact sheet prior to receiving the infusion.Consuello Masse, DNP, AGNP-C [phone] (Infusion Center Hotline)

## 2020-07-13 ENCOUNTER — Ambulatory Visit (HOSPITAL_COMMUNITY): Payer: BC Managed Care – PPO

## 2020-07-15 ENCOUNTER — Ambulatory Visit: Payer: Self-pay

## 2020-07-26 ENCOUNTER — Ambulatory Visit: Payer: Self-pay

## 2020-08-28 LAB — OB RESULTS CONSOLE GBS: GBS: NEGATIVE

## 2020-09-11 ENCOUNTER — Telehealth (HOSPITAL_COMMUNITY): Payer: Self-pay | Admitting: *Deleted

## 2020-09-11 ENCOUNTER — Encounter (HOSPITAL_COMMUNITY): Payer: Self-pay | Admitting: *Deleted

## 2020-09-11 NOTE — Telephone Encounter (Signed)
Preadmission screen  

## 2020-09-18 ENCOUNTER — Other Ambulatory Visit (HOSPITAL_COMMUNITY): Payer: BC Managed Care – PPO

## 2020-09-19 ENCOUNTER — Inpatient Hospital Stay (HOSPITAL_COMMUNITY): Payer: BC Managed Care – PPO | Admitting: Anesthesiology

## 2020-09-19 ENCOUNTER — Encounter (HOSPITAL_COMMUNITY): Payer: Self-pay | Admitting: Obstetrics and Gynecology

## 2020-09-19 ENCOUNTER — Inpatient Hospital Stay (HOSPITAL_COMMUNITY)
Admission: AD | Admit: 2020-09-19 | Discharge: 2020-09-21 | DRG: 807 | Disposition: A | Discharge status: Home / Self Care | Payer: BC Managed Care – PPO | Attending: Obstetrics & Gynecology | Admitting: Obstetrics & Gynecology

## 2020-09-19 ENCOUNTER — Other Ambulatory Visit: Payer: Self-pay

## 2020-09-19 DIAGNOSIS — O24420 Gestational diabetes mellitus in childbirth, diet controlled: Secondary | ICD-10-CM | POA: Diagnosis present

## 2020-09-19 DIAGNOSIS — Z3A39 39 weeks gestation of pregnancy: Secondary | ICD-10-CM | POA: Diagnosis not present

## 2020-09-19 DIAGNOSIS — Z3490 Encounter for supervision of normal pregnancy, unspecified, unspecified trimester: Secondary | ICD-10-CM

## 2020-09-19 DIAGNOSIS — O26893 Other specified pregnancy related conditions, third trimester: Secondary | ICD-10-CM | POA: Diagnosis present

## 2020-09-19 LAB — CBC
HCT: 41.7 % (ref 36.0–46.0)
HCT: 35.9 % — ABNORMAL LOW (ref 36.0–46.0)
Hemoglobin: 13.9 g/dL (ref 12.0–15.0)
Hemoglobin: 12 g/dL (ref 12.0–15.0)
MCH: 28.3 pg (ref 26.0–34.0)
MCH: 28.4 pg (ref 26.0–34.0)
MCHC: 33.3 g/dL (ref 30.0–36.0)
MCHC: 33.4 g/dL (ref 30.0–36.0)
MCV: 84.8 fL (ref 80.0–100.0)
MCV: 84.9 fL (ref 80.0–100.0)
Platelets: 291 10*3/uL (ref 150–400)
Platelets: 222 10*3/uL (ref 150–400)
RBC: 4.92 MIL/uL (ref 3.87–5.11)
RBC: 4.23 MIL/uL (ref 3.87–5.11)
RDW: 13.3 % (ref 11.5–15.5)
RDW: 13.2 % (ref 11.5–15.5)
WBC: 16.5 10*3/uL — ABNORMAL HIGH (ref 4.0–10.5)
WBC: 18.2 10*3/uL — ABNORMAL HIGH (ref 4.0–10.5)
nRBC: 0 % (ref 0.0–0.2)
nRBC: 0 % (ref 0.0–0.2)

## 2020-09-19 LAB — RPR: RPR Ser Ql: NONREACTIVE

## 2020-09-19 LAB — GLUCOSE, CAPILLARY: Glucose-Capillary: 90 mg/dL (ref 70–99)

## 2020-09-19 LAB — TYPE AND SCREEN
ABO/RH(D): A POS
Antibody Screen: NEGATIVE

## 2020-09-19 MED ORDER — ACETAMINOPHEN 325 MG PO TABS: 650.0000 mg | ORAL_TABLET | ORAL | Status: DC | PRN

## 2020-09-19 MED ORDER — OXYTOCIN-SODIUM CHLORIDE 30-0.9 UT/500ML-% IV SOLN
2.5000 [IU]/h | INTRAVENOUS | Status: DC
  Administered 2020-09-19: 2.5 [IU]/h via INTRAVENOUS
  Filled 2020-09-19: qty 500

## 2020-09-19 MED ORDER — OXYCODONE-ACETAMINOPHEN 5-325 MG PO TABS: 2.0000 | ORAL_TABLET | ORAL | Status: DC | PRN

## 2020-09-19 MED ORDER — TERBUTALINE SULFATE 1 MG/ML IJ SOLN: 0.2500 mg | Freq: Once | INTRAMUSCULAR | Status: DC | PRN

## 2020-09-19 MED ORDER — SOD CITRATE-CITRIC ACID 500-334 MG/5ML PO SOLN: 30.0000 mL | ORAL | Status: DC | PRN

## 2020-09-19 MED ORDER — OXYCODONE-ACETAMINOPHEN 5-325 MG PO TABS
1.0000 | ORAL_TABLET | ORAL | Status: DC | PRN
  Filled 2020-09-19: qty 1

## 2020-09-19 MED ORDER — COCONUT OIL OIL
1.0000 "application " | TOPICAL_OIL | Status: DC | PRN
  Administered 2020-09-19: 1 via TOPICAL

## 2020-09-19 MED ORDER — LIDOCAINE HCL (PF) 1 % IJ SOLN: 30.0000 mL | INTRAMUSCULAR | Status: DC | PRN

## 2020-09-19 MED ORDER — FENTANYL-BUPIVACAINE-NACL 0.5-0.125-0.9 MG/250ML-% EP SOLN: 12.0000 mL/h | EPIDURAL | Status: DC | PRN

## 2020-09-19 MED ORDER — LACTATED RINGERS IV SOLN: INTRAVENOUS | Status: DC

## 2020-09-19 MED ORDER — ONDANSETRON HCL 4 MG PO TABS: 4.0000 mg | ORAL_TABLET | ORAL | Status: DC | PRN

## 2020-09-19 MED ORDER — IBUPROFEN 600 MG PO TABS
600.0000 mg | ORAL_TABLET | Freq: Four times a day (QID) | ORAL | Status: DC
  Administered 2020-09-19 – 2020-09-21 (×8): 600 mg via ORAL
  Filled 2020-09-19 (×8): qty 1

## 2020-09-19 MED ORDER — SIMETHICONE 80 MG PO CHEW: 80.0000 mg | CHEWABLE_TABLET | ORAL | Status: DC | PRN

## 2020-09-19 MED ORDER — PHENYLEPHRINE 40 MCG/ML (10ML) SYRINGE FOR IV PUSH (FOR BLOOD PRESSURE SUPPORT): 80.0000 ug | PREFILLED_SYRINGE | INTRAVENOUS | Status: DC | PRN

## 2020-09-19 MED ORDER — DIBUCAINE (PERIANAL) 1 % EX OINT: 1.0000 "application " | TOPICAL_OINTMENT | CUTANEOUS | Status: DC | PRN

## 2020-09-19 MED ORDER — MISOPROSTOL 25 MCG QUARTER TABLET: 25.0000 ug | ORAL_TABLET | ORAL | Status: DC | PRN

## 2020-09-19 MED ORDER — OXYTOCIN-SODIUM CHLORIDE 30-0.9 UT/500ML-% IV SOLN: 1.0000 m[IU]/min | INTRAVENOUS | Status: DC

## 2020-09-19 MED ORDER — LIDOCAINE-EPINEPHRINE (PF) 2 %-1:200000 IJ SOLN
INTRAMUSCULAR | Status: DC | PRN
  Administered 2020-09-19: 5 mL via EPIDURAL

## 2020-09-19 MED ORDER — LACTATED RINGERS IV SOLN
500.0000 mL | Freq: Once | INTRAVENOUS | Status: AC
  Administered 2020-09-19: 500 mL via INTRAVENOUS

## 2020-09-19 MED ORDER — EPHEDRINE 5 MG/ML INJ: 10.0000 mg | INTRAVENOUS | Status: DC | PRN

## 2020-09-19 MED ORDER — OXYCODONE-ACETAMINOPHEN 5-325 MG PO TABS: 1.0000 | ORAL_TABLET | ORAL | Status: DC | PRN

## 2020-09-19 MED ORDER — SENNOSIDES-DOCUSATE SODIUM 8.6-50 MG PO TABS
2.0000 | ORAL_TABLET | ORAL | Status: DC
  Administered 2020-09-19 – 2020-09-20 (×2): 2 via ORAL
  Filled 2020-09-19 (×2): qty 2

## 2020-09-19 MED ORDER — PRENATAL MULTIVITAMIN CH
1.0000 | ORAL_TABLET | Freq: Every day | ORAL | Status: DC
  Administered 2020-09-20 – 2020-09-21 (×2): 1 via ORAL
  Filled 2020-09-19 (×2): qty 1

## 2020-09-19 MED ORDER — DIPHENHYDRAMINE HCL 50 MG/ML IJ SOLN: 12.5000 mg | INTRAMUSCULAR | Status: DC | PRN

## 2020-09-19 MED ORDER — WITCH HAZEL-GLYCERIN EX PADS: 1.0000 "application " | MEDICATED_PAD | CUTANEOUS | Status: DC | PRN

## 2020-09-19 MED ORDER — ACETAMINOPHEN 325 MG PO TABS
650.0000 mg | ORAL_TABLET | ORAL | Status: DC | PRN
  Administered 2020-09-19: 650 mg via ORAL
  Filled 2020-09-19: qty 2

## 2020-09-19 MED ORDER — SODIUM CHLORIDE (PF) 0.9 % IJ SOLN
INTRAMUSCULAR | Status: DC | PRN
  Administered 2020-09-19: 12 mL/h via EPIDURAL

## 2020-09-19 MED ORDER — FENTANYL-BUPIVACAINE-NACL 0.5-0.125-0.9 MG/250ML-% EP SOLN
EPIDURAL | Status: AC
  Filled 2020-09-19: qty 250

## 2020-09-19 MED ORDER — BENZOCAINE-MENTHOL 20-0.5 % EX AERO
1.0000 "application " | INHALATION_SPRAY | CUTANEOUS | Status: DC | PRN
  Filled 2020-09-19: qty 56

## 2020-09-19 MED ORDER — ONDANSETRON HCL 4 MG/2ML IJ SOLN: 4.0000 mg | INTRAMUSCULAR | Status: DC | PRN

## 2020-09-19 MED ORDER — OXYTOCIN BOLUS FROM INFUSION
333.0000 mL | Freq: Once | INTRAVENOUS | Status: AC
  Administered 2020-09-19: 333 mL via INTRAVENOUS

## 2020-09-19 MED ORDER — LACTATED RINGERS IV SOLN: 500.0000 mL | INTRAVENOUS | Status: DC | PRN

## 2020-09-19 MED ORDER — ONDANSETRON HCL 4 MG/2ML IJ SOLN: 4.0000 mg | Freq: Four times a day (QID) | INTRAMUSCULAR | Status: DC | PRN

## 2020-09-19 MED ORDER — DIPHENHYDRAMINE HCL 25 MG PO CAPS: 25.0000 mg | ORAL_CAPSULE | Freq: Four times a day (QID) | ORAL | Status: DC | PRN

## 2020-09-19 MED ORDER — TETANUS-DIPHTH-ACELL PERTUSSIS 5-2.5-18.5 LF-MCG/0.5 IM SUSY: 0.5000 mL | PREFILLED_SYRINGE | Freq: Once | INTRAMUSCULAR | Status: DC

## 2020-09-19 MED ORDER — ZOLPIDEM TARTRATE 5 MG PO TABS: 5.0000 mg | ORAL_TABLET | Freq: Every evening | ORAL | Status: DC | PRN

## 2020-09-19 NOTE — Anesthesia Preprocedure Evaluation (Signed)
Anesthesia Evaluation  Patient identified by MRN, date of birth, ID band Patient awake    Reviewed: Allergy & Precautions, NPO status , Patient's Chart, lab work & pertinent test results  Airway Mallampati: II  TM Distance: >3 FB Neck ROM: Full    Dental no notable dental hx.    Pulmonary neg pulmonary ROS,    Pulmonary exam normal breath sounds clear to auscultation       Cardiovascular negative cardio ROS Normal cardiovascular exam Rhythm:Regular Rate:Normal     Neuro/Psych negative neurological ROS  negative psych ROS   GI/Hepatic negative GI ROS, Neg liver ROS,   Endo/Other  negative endocrine ROSdiabetes, Gestational  Renal/GU negative Renal ROS  negative genitourinary   Musculoskeletal negative musculoskeletal ROS (+)   Abdominal   Peds  Hematology negative hematology ROS (+)   Anesthesia Other Findings   Reproductive/Obstetrics (+) Pregnancy                             Anesthesia Physical Anesthesia Plan  ASA: III  Anesthesia Plan: Epidural   Post-op Pain Management:    Induction:   PONV Risk Score and Plan: Treatment may vary due to age or medical condition  Airway Management Planned: Natural Airway  Additional Equipment:   Intra-op Plan:   Post-operative Plan:   Informed Consent: I have reviewed the patients History and Physical, chart, labs and discussed the procedure including the risks, benefits and alternatives for the proposed anesthesia with the patient or authorized representative who has indicated his/her understanding and acceptance.       Plan Discussed with: Anesthesiologist  Anesthesia Plan Comments: (Patient identified. Risks, benefits, options discussed with patient including but not limited to bleeding, infection, nerve damage, paralysis, failed block, incomplete pain control, headache, blood pressure changes, nausea, vomiting, reactions to  medication, itching, and post partum back pain. Confirmed with bedside nurse the patient's most recent platelet count. Confirmed with the patient that they are not taking any anticoagulation, have any bleeding history or any family history of bleeding disorders. Patient expressed understanding and wishes to proceed. All questions were answered. )        Anesthesia Quick Evaluation  

## 2020-09-19 NOTE — MAU Note (Signed)
REgular ctxs since 2230. Denies LOF or VB. For IOL Fri am. GDM -diet controlled.

## 2020-09-19 NOTE — H&P (Signed)
Natalie Barnes is a 29 y.o. female presenting for active labor and srom.  Pregnancy complicated by A1DM with great control.  GBS-. OB History    Gravida  1   Para      Term      Preterm      AB      Living        SAB      TAB      Ectopic      Multiple      Live Births             Past Medical History:  Diagnosis Date   Gestational diabetes    Past Surgical History:  Procedure Laterality Date   NO PAST SURGERIES     Family History: family history includes Diabetes in her father. Social History:  reports that she has never smoked. She has never used smokeless tobacco. She reports that she does not drink alcohol and does not use drugs.     Maternal Diabetes: Yes:  Diabetes Type:  Diet controlled Genetic Screening: Normal Maternal Ultrasounds/Referrals: Normal Fetal Ultrasounds or other Referrals:  None Maternal Substance Abuse:  No Significant Maternal Medications:  None Significant Maternal Lab Results:  Group B Strep negative Other Comments:  None  Review of Systems History Dilation: Lip/rim Effacement (%): 90 Station: -1, 0 Exam by:: Lamont Snowball, RN Blood pressure 133/89, pulse 75, temperature (!) 97.5 F (36.4 C), temperature source Oral, resp. rate 18, height 5\' 3"  (1.6 m), weight 78.5 kg. Exam Physical Exam  Prenatal labs: ABO, Rh: --/--/A POS (11/11 0238) Antibody: NEG (11/11 0238) Rubella: Immune (04/16 0000) RPR: Nonreactive (04/16 0000)  HBsAg: Negative (04/16 0000)  HIV: Non-reactive (04/16 0000)  GBS:     Assessment/Plan: IUP at term Active labor Anticipate SVD A1DM in great control   04-15-1995 09/19/2020, 7:31 AM

## 2020-09-19 NOTE — Progress Notes (Signed)
To Bs Via stretcher

## 2020-09-19 NOTE — Lactation Note (Addendum)
This note was copied from a baby's chart. Lactation Consultation Note  Patient Name: Natalie Barnes Today's Date: 09/19/2020 Reason for consult: Initial assessment;Mother's request;Primapara;1st time breastfeeding;Term;Infant < 6lbs;Maternal endocrine disorder Type of Endocrine Disorder?: Diabetes (GDM Diet controlled. Infant Hypoglycemial being monitored)  Infant is 39 weeks 9 hours old. Infant being monitored for hypoglycemia. Glucose 30, 30 and 60.  Mom started formula Similac 22 calorie as a result of the low glucose. Mom noted breast changes with pregnancy. Infant fed at the breast 14 minutes at 10:50 am and  5 minutes at 12:25 pm. Infant had 1 urine so far. Supplementation with Similac Expert Care Neosure 22 cal/oz started at 1300 of 7 ml using extra slow flow nipple.   Mom's nipples are erect and non tender. LC applied heat, breast massage and hand expression. No drops of colostrum noted. Mom set up on DEBP and parts, assembly, cleaning and storage reviewed with parents.  LC reviewed behavior to decrease caloric loss for LPTI with parents.   LC able to latch infant with signs of milk transfer for 12 minutes.   Mom has a Spectra 2 pump at home.   Plan 1. To feed based on cues 8-12x in 24 hour period no more than 3 hours without an attempt. Mom to place infant STS and look for signs of milk transfer with feeding. Total feeding at breast and paced bottle feeding with Similac should be under 30 minutes.          2. Mom to supplement with either EBM or formula based on LPTI guidelines and hours after birth.         3. Mom to pump using 27 flange q 3 hours for 15 minutes on the initial phase.            4. I's and O's sheet reviewed with parents.          5. LC brochure of inpatient and outpatient services reviewed as well.

## 2020-09-19 NOTE — Lactation Note (Signed)
This note was copied from a baby's chart. Lactation Consultation Note  Patient Name: Natalie Barnes Today's Date: 09/19/2020   Mom and baby sleeping upon visit. LC will come back to room at another time as possible.     Natalie Barnes 09/19/2020, 2:35 PM

## 2020-09-19 NOTE — Progress Notes (Signed)
Dr Rana Snare notified of pt's admission and assessment. Aware of ctx pattern, sve, elevated B/Ps, GDM -diet controlled. Will admit to [facility]

## 2020-09-19 NOTE — Anesthesia Procedure Notes (Signed)
Epidural Patient location during procedure: OB Start time: 09/19/2020 3:15 AM End time: 09/19/2020 3:25 AM  Staffing Anesthesiologist: Elmer Picker, MD Performed: anesthesiologist   Preanesthetic Checklist Completed: patient identified, IV checked, risks and benefits discussed, monitors and equipment checked, pre-op evaluation and timeout performed  Epidural Patient position: sitting Prep: DuraPrep and site prepped and draped Patient monitoring: continuous pulse ox, blood pressure, heart rate and cardiac monitor Approach: midline Location: L3-L4 Injection technique: LOR air  Needle:  Needle type: Tuohy  Needle gauge: 17 G Needle length: 9 cm Needle insertion depth: 5 cm Catheter type: closed end flexible Catheter size: 19 Gauge Catheter at skin depth: 11 cm Test dose: negative  Assessment Sensory level: T8 Events: blood not aspirated, injection not painful, no injection resistance, no paresthesia and negative IV test  Additional Notes Patient identified. Risks/Benefits/Options discussed with patient including but not limited to bleeding, infection, nerve damage, paralysis, failed block, incomplete pain control, headache, blood pressure changes, nausea, vomiting, reactions to medication both or allergic, itching and postpartum back pain. Confirmed with bedside nurse the patient's most recent platelet count. Confirmed with patient that they are not currently taking any anticoagulation, have any bleeding history or any family history of bleeding disorders. Patient expressed understanding and wished to proceed. All questions were answered. Sterile technique was used throughout the entire procedure. Please see nursing notes for vital signs. Test dose was given through epidural catheter and negative prior to continuing to dose epidural or start infusion. Warning signs of high block given to the patient including shortness of breath, tingling/numbness in hands, complete motor block,  or any concerning symptoms with instructions to call for help. Patient was given instructions on fall risk and not to get out of bed. All questions and concerns addressed with instructions to call with any issues or inadequate analgesia.  Reason for block:procedure for pain

## 2020-09-20 ENCOUNTER — Inpatient Hospital Stay (HOSPITAL_COMMUNITY)
Admission: AD | Admit: 2020-09-20 | Payer: BC Managed Care – PPO | Source: Home / Self Care | Admitting: Obstetrics and Gynecology

## 2020-09-20 ENCOUNTER — Inpatient Hospital Stay (HOSPITAL_COMMUNITY): Payer: BC Managed Care – PPO

## 2020-09-20 LAB — COMPREHENSIVE METABOLIC PANEL
ALT: 19 U/L (ref 0–44)
AST: 25 U/L (ref 15–41)
Albumin: 2.6 g/dL — ABNORMAL LOW (ref 3.5–5.0)
Alkaline Phosphatase: 91 U/L (ref 38–126)
Anion gap: 10 (ref 5–15)
BUN: 12 mg/dL (ref 6–20)
CO2: 21 mmol/L — ABNORMAL LOW (ref 22–32)
Calcium: 9.2 mg/dL (ref 8.9–10.3)
Chloride: 107 mmol/L (ref 98–111)
Creatinine, Ser: 0.55 mg/dL (ref 0.44–1.00)
GFR, Estimated: >60 mL/min (ref >60)
Glucose, Bld: 88 mg/dL (ref 70–99)
Potassium: 4.2 mmol/L (ref 3.5–5.1)
Sodium: 138 mmol/L (ref 135–145)
Total Bilirubin: 0.6 mg/dL (ref 0.3–1.2)
Total Protein: 5.6 g/dL — ABNORMAL LOW (ref 6.5–8.1)

## 2020-09-20 LAB — CBC
HCT: 35.3 % — ABNORMAL LOW (ref 36.0–46.0)
Hemoglobin: 11.3 g/dL — ABNORMAL LOW (ref 12.0–15.0)
MCH: 27.4 pg (ref 26.0–34.0)
MCHC: 32 g/dL (ref 30.0–36.0)
MCV: 85.7 fL (ref 80.0–100.0)
Platelets: 220 10*3/uL (ref 150–400)
RBC: 4.12 MIL/uL (ref 3.87–5.11)
RDW: 13.5 % (ref 11.5–15.5)
WBC: 17.1 10*3/uL — ABNORMAL HIGH (ref 4.0–10.5)
nRBC: 0 % (ref 0.0–0.2)

## 2020-09-20 LAB — RUBELLA SCREEN: Rubella: 3.15 index (ref >0.99)

## 2020-09-20 NOTE — Progress Notes (Signed)
PPD # 1  Doing well No complaints  BP 131/81 (BP Location: Left Arm)   Pulse 80   Temp (!) 97.2 F (36.2 C) (Oral)   Resp 16   Ht 5\' 3"  (1.6 m)   Wt 78.5 kg   LMP  (LMP Unknown) Comment: pt reports prior to march  SpO2 100%   BMI 30.65 kg/m  Results for orders placed or performed during the hospital encounter of 09/19/20 (from the past 24 hour(s))  CBC     Status: Abnormal   Collection Time: 09/19/20 11:05 AM  Result Value Ref Range   WBC 18.2 (H) 4.0 - 10.5 K/uL   RBC 4.23 3.87 - 5.11 MIL/uL   Hemoglobin 12.0 12.0 - 15.0 g/dL   HCT 13/11/21 (L) 36 - 46 %   MCV 84.9 80.0 - 100.0 fL   MCH 28.4 26.0 - 34.0 pg   MCHC 33.4 30.0 - 36.0 g/dL   RDW 62.9 52.8 - 41.3 %   Platelets 222 150 - 400 K/uL   nRBC 0.0 0.0 - 0.2 %  CBC     Status: Abnormal   Collection Time: 09/20/20  5:28 AM  Result Value Ref Range   WBC 17.1 (H) 4.0 - 10.5 K/uL   RBC 4.12 3.87 - 5.11 MIL/uL   Hemoglobin 11.3 (L) 12.0 - 15.0 g/dL   HCT 13/12/21 (L) 36 - 46 %   MCV 85.7 80.0 - 100.0 fL   MCH 27.4 26.0 - 34.0 pg   MCHC 32.0 30.0 - 36.0 g/dL   RDW 01.0 27.2 - 53.6 %   Platelets 220 150 - 400 K/uL   nRBC 0.0 0.0 - 0.2 %  Comprehensive metabolic panel     Status: Abnormal   Collection Time: 09/20/20  5:28 AM  Result Value Ref Range   Sodium 138 135 - 145 mmol/L   Potassium 4.2 3.5 - 5.1 mmol/L   Chloride 107 98 - 111 mmol/L   CO2 21 (L) 22 - 32 mmol/L   Glucose, Bld 88 70 - 99 mg/dL   BUN 12 6 - 20 mg/dL   Creatinine, Ser 13/12/21 0.44 - 1.00 mg/dL   Calcium 9.2 8.9 - 0.34 mg/dL   Total Protein 5.6 (L) 6.5 - 8.1 g/dL   Albumin 2.6 (L) 3.5 - 5.0 g/dL   AST 25 15 - 41 U/L   ALT 19 0 - 44 U/L   Alkaline Phosphatase 91 38 - 126 U/L   Total Bilirubin 0.6 0.3 - 1.2 mg/dL   GFR, Estimated 74.2 >59 mL/min   Anion gap 10 5 - 15   Abdomen is soft and non tender Lochia WNL  PPD # 1  Doing well Routine care Circ today  Discharge home tomorrow

## 2020-09-20 NOTE — Anesthesia Postprocedure Evaluation (Signed)
Anesthesia Post Note  Patient: Natalie Barnes  Procedure(s) Performed: AN AD HOC LABOR EPIDURAL     Patient location during evaluation: Mother Baby Anesthesia Type: Epidural Level of consciousness: awake and alert, oriented and patient cooperative Pain management: pain level controlled Vital Signs Assessment: post-procedure vital signs reviewed and stable Respiratory status: spontaneous breathing Cardiovascular status: stable Postop Assessment: no headache, epidural receding, patient able to bend at knees and no signs of nausea or vomiting Anesthetic complications: no Comments: Pt. States she is walking. Pain score 1.    No complications documented.  Last Vitals:  Vitals:   09/20/20 0139 09/20/20 0524  BP: 125/87 131/81  Pulse: 69 80  Resp:  16  Temp: 36.6 C (!) 36.2 C  SpO2: 99% 100%    Last Pain:  Vitals:   09/20/20 0715  TempSrc:   PainSc: Asleep   Pain Goal: Patients Stated Pain Goal: 0 (09/19/20 0149)                 Merrilyn Puma

## 2020-09-20 NOTE — Lactation Note (Signed)
This note was copied from a baby's chart. Lactation Consultation Note  Patient Name: Natalie Barnes Today's Date: 09/20/2020 Reason for consult: Follow-up assessment;Term;Primapara;1st time breastfeeding  P1 mother whose infant is now 64 hours old.  This is a term baby at 39+1 weeks weighing < 6 lbs.  Baby had 2 low blood sugars initially but has consistently been stable since 1640 on 09/19/20.  Father had just started formula feeding baby when I arrived.  Offered to assist with latching and mother agreeable.  Mother's breasts are soft and non tender and nipples are everted and intact.  She has an old bruise from an incorrect early latch on her right breast.  Reviewed hand expression with mother.  She had 0.5 mls of EBM at bedside from her last pumping session.  Encouraged to feed back any EBM she obtains to baby.  Assisted baby to latch in the football hold on the right breast fairly easily.  Demonstrated how to latch deeply, breast compressions, gentle stimulation and proper body alignment.  Baby had a circumcision this a.m.  Informed parents that he may be sleepier today and want to feed more often tonight.  Observed baby feeding for 16 minutes before he released.  Intermittent audible swallows noted.  Placed him STS and he began rooting.  Assisted back to the breast for another 7 minutes before he released.  Father provided a 10 ml supplementation of Similac 22 calorie formula.  Reviewed paced bottle feeding and baby fed well with the purple extra slow flow nipple.  Observed mother pumping and changed the #27 flange size to the #24 flange size for greater fit and comfort.  Mother stated that this size felt "much better" and did not pull too much areola into the flanges.  She will continue to feed on cue, supplement and pump throughout the evening/night.  Discussed with RN using Similac 20 calorie formula now that the blood sugars have been stable.  RN will bring in Similac 20 calorie for the next  feeding.    Mother has a DEBP for home use.  Parents work well together and are very supportive of each other.  RN in room at the end of my consult.  Mother will call for assistance as needed.   Maternal Data Formula Feeding for Exclusion: No Has patient been taught Hand Expression?: Yes Does the patient have breastfeeding experience prior to this delivery?: No  Feeding Feeding Type: Breast Fed  LATCH Score Latch: Grasps breast easily, tongue down, lips flanged, rhythmical sucking.  Audible Swallowing: A few with stimulation  Type of Nipple: Everted at rest and after stimulation  Comfort (Breast/Nipple): Soft / non-tender  Hold (Positioning): Assistance needed to correctly position infant at breast and maintain latch.  LATCH Score: 8  Interventions Interventions: Breast feeding basics reviewed;Assisted with latch;Skin to skin;Breast massage;Hand express;Breast compression;Adjust position;DEBP;Expressed milk;Position options;Support pillows  Lactation Tools Discussed/Used Breast pump type: Double-Electric Breast Pump;Manual Pump Review:  (Reviewed)   Consult Status Consult Status: Follow-up Date: 09/21/20 Follow-up type: In-patient    Natalie Barnes 09/20/2020, 2:55 PM

## 2020-09-21 NOTE — Lactation Note (Signed)
This note was copied from a baby's chart. Lactation Consultation Note  Patient Name: Natalie Barnes Today's Date: 09/21/2020   At time of consult, infant was 46 hrs old and had gained 15 g. Mom has been pumping about q2-3hrs and most recently expressed about 5-6 mL.  Mom is leaning towards exclusive pumping only, but also says she will continue to try to latch infant to the breast. Mom's nipples are pink, but atraumatic. Mom's R areola has some pinkness, but Mom denies itching or pain. There is a mark on R areola where infant missed the latch.  Mom took herself to a size 27 flange when she realized the size 24 flange was too tight. Mom has a Spectra pump at home.  Parents know not to soak parts for more than 5 minutes Mom knows to pump whenever formula is given to baby. Mom knows how to get in touch with [place] for post-discharge questions.     Lurline Hare [facility] 09/21/2020, 10:49 AM

## 2020-09-21 NOTE — Discharge Summary (Signed)
Postpartum Discharge Summary  Date of Service September 21, 2020     Patient Name: Natalie Barnes DOB: [birth year] MRN: [id number]  Date of admission: 09/19/2020 Delivery date:09/19/2020  Delivering provider: Linda Hedges  Date of discharge: 09/21/2020  Admitting diagnosis: Pregnancy [Z34.90] Intrauterine pregnancy: [redacted]w[redacted]d    Secondary diagnosis:  Active Problems:   Pregnancy  Additional problems: none    Discharge diagnosis: Term Pregnancy Delivered                                              Post partum procedures:none Augmentation: Pitocin Complications NONE  Hospital course: Onset of Labor With Vaginal Delivery      29y.o. yo G1P0 at 30w3das admitted in Active Labor on 09/19/2020. Patient had an uncomplicated labor course as follows:  Membrane Rupture Time/Date: 7:36 AM ,09/19/2020   Delivery Method:Vaginal, Spontaneous  Episiotomy: None  Lacerations:  Periurethral  Patient had an uncomplicated postpartum course.  She is ambulating, tolerating a regular diet, passing flatus, and urinating well. Patient is discharged home in stable condition on 09/21/20.  Newborn Data: Birth date:09/19/2020  Birth time:9:56 AM  Gender:Female  Living status:Living  Apgars:9 ,10  Weight:2495 g   Magnesium Sulfate received: No BMZ received: No Rhophylac:No MMR:No T-DaP:Given prenatally Flu: No Transfusion:No  Physical exam  Vitals:   09/20/20 0524 09/20/20 1433 09/20/20 2050 09/20/20 2319  BP: 131/81 121/78 125/83 122/76  Pulse: 80 81 90 84  Resp: '16 18 20 18  ' Temp: (!) 97.2 F (36.2 C) 98.6 F (37 C) 98.7 F (37.1 C) 98.5 F (36.9 C)  TempSrc: Oral Oral Oral Oral  SpO2: 100% 100% 99% 100%  Weight:      Height:       General: alert Lochia: appropriate Uterine Fundus: firm Incision: Healing well with no significant drainage DVT Evaluation: No evidence of DVT seen on physical exam. Labs: Lab Results  Component Value Date   WBC 17.1 (H) 09/20/2020   HGB 11.3 (L)  09/20/2020   HCT 35.3 (L) 09/20/2020   MCV 85.7 09/20/2020   PLT 220 09/20/2020   CMP Latest Ref Rng & Units 09/20/2020  Glucose 70 - 99 mg/dL 88  BUN 6 - 20 mg/dL 12  Creatinine 0.44 - 1.00 mg/dL 0.55  Sodium 135 - 145 mmol/L 138  Potassium 3.5 - 5.1 mmol/L 4.2  Chloride 98 - 111 mmol/L 107  CO2 22 - 32 mmol/L 21(L)  Calcium 8.9 - 10.3 mg/dL 9.2  Total Protein 6.5 - 8.1 g/dL 5.6(L)  Total Bilirubin 0.3 - 1.2 mg/dL 0.6  Alkaline Phos 38 - 126 U/L 91  AST 15 - 41 U/L 25  ALT 0 - 44 U/L 19   Edinburgh Score: Edinburgh Postnatal Depression Scale Screening Tool 09/20/2020  I have been able to laugh and see the funny side of things. 0  I have looked forward with enjoyment to things. 0  I have blamed myself unnecessarily when things went wrong. 2  I have been anxious or worried for no good reason. 2  I have felt scared or panicky for no good reason. 1  Things have been getting on top of me. 1  I have been so unhappy that I have had difficulty sleeping. 0  I have felt sad or miserable. 0  I have been so unhappy that I have been crying. 0  The thought of harming myself has occurred to me. 0  Edinburgh Postnatal Depression Scale Total 6      After visit meds:  Allergies as of 09/21/2020      Reactions   Penicillins Hives, Diarrhea, Nausea Only, Rash      Medication List    TAKE these medications   guaiFENesin 600 MG 12 hr tablet Commonly known as: Mucinex Take 1 tablet (600 mg total) by mouth 2 (two) times daily.   prenatal multivitamin Tabs tablet Take 1 tablet by mouth daily at 12 noon.        Discharge home in stable condition Infant Feeding: Breast Infant Disposition:home with mother Discharge instruction: per After Visit Summary and Postpartum booklet. Activity: Advance as tolerated. Pelvic rest for 6 weeks.  Diet: routine diet Anticipated Birth Control: Unsure Postpartum Appointment:6 weeks Additional Postpartum F/U: NONE Future Appointments:No future  appointments. Follow up Visit:      09/21/2020 Cyril Mourning, MD

## 2020-09-21 NOTE — Progress Notes (Signed)
Mother very concerned that infant has not voided since 1800 yesterday evening. Infant has voided since circumcision.  Explained to mother how many wet diapers infant should have in any given day.  Mother verbalized understanding.

## 2022-01-30 DIAGNOSIS — N911 Secondary amenorrhea: Secondary | ICD-10-CM | POA: Diagnosis not present

## 2022-01-31 ENCOUNTER — Other Ambulatory Visit: Payer: Self-pay

## 2022-01-31 ENCOUNTER — Inpatient Hospital Stay (HOSPITAL_COMMUNITY)
Admission: AD | Admit: 2022-01-31 | Discharge: 2022-01-31 | Disposition: A | Discharge status: Home / Self Care | Payer: BC Managed Care – PPO | Attending: Obstetrics and Gynecology | Admitting: Obstetrics and Gynecology

## 2022-01-31 ENCOUNTER — Inpatient Hospital Stay (HOSPITAL_COMMUNITY): Payer: BC Managed Care – PPO

## 2022-01-31 ENCOUNTER — Encounter (HOSPITAL_COMMUNITY): Payer: Self-pay | Admitting: Obstetrics and Gynecology

## 2022-01-31 DIAGNOSIS — Z880 Allergy status to penicillin: Secondary | ICD-10-CM | POA: Insufficient documentation

## 2022-01-31 DIAGNOSIS — Z3A00 Weeks of gestation of pregnancy not specified: Secondary | ICD-10-CM | POA: Diagnosis not present

## 2022-01-31 DIAGNOSIS — O039 Complete or unspecified spontaneous abortion without complication: Secondary | ICD-10-CM

## 2022-01-31 DIAGNOSIS — O209 Hemorrhage in early pregnancy, unspecified: Secondary | ICD-10-CM | POA: Diagnosis not present

## 2022-01-31 DIAGNOSIS — Z3A01 Less than 8 weeks gestation of pregnancy: Secondary | ICD-10-CM | POA: Diagnosis not present

## 2022-01-31 DIAGNOSIS — Z6790 Unspecified blood type, Rh positive: Secondary | ICD-10-CM

## 2022-01-31 LAB — CBC WITH DIFFERENTIAL/PLATELET
Abs Immature Granulocytes: 0.12 10*3/uL — ABNORMAL HIGH (ref 0.00–0.07)
Basophils Absolute: 0.1 10*3/uL (ref 0.0–0.1)
Basophils Relative: 0 %
Eosinophils Absolute: 0.1 10*3/uL (ref 0.0–0.5)
Eosinophils Relative: 0 %
HCT: 39.8 % (ref 36.0–46.0)
Hemoglobin: 13.2 g/dL (ref 12.0–15.0)
Immature Granulocytes: 1 %
Lymphocytes Relative: 12 %
Lymphs Abs: 2.4 10*3/uL (ref 0.7–4.0)
MCH: 27.2 pg (ref 26.0–34.0)
MCHC: 33.2 g/dL (ref 30.0–36.0)
MCV: 81.9 fL (ref 80.0–100.0)
Monocytes Absolute: 1.1 10*3/uL — ABNORMAL HIGH (ref 0.1–1.0)
Monocytes Relative: 5 %
Neutro Abs: 16.7 10*3/uL — ABNORMAL HIGH (ref 1.7–7.7)
Neutrophils Relative %: 82 %
Platelets: 328 10*3/uL (ref 150–400)
RBC: 4.86 MIL/uL (ref 3.87–5.11)
RDW: 12.6 % (ref 11.5–15.5)
WBC: 20.3 10*3/uL — ABNORMAL HIGH (ref 4.0–10.5)
nRBC: 0 % (ref 0.0–0.2)

## 2022-01-31 LAB — URINALYSIS, ROUTINE W REFLEX MICROSCOPIC
Bilirubin Urine: NEGATIVE
Glucose, UA: NEGATIVE mg/dL
Ketones, ur: NEGATIVE mg/dL
Leukocytes,Ua: NEGATIVE
Nitrite: NEGATIVE
Protein, ur: NEGATIVE mg/dL
Specific Gravity, Urine: 1.002 — ABNORMAL LOW (ref 1.005–1.030)
pH: 7 (ref 5.0–8.0)

## 2022-01-31 LAB — COMPREHENSIVE METABOLIC PANEL
ALT: 15 U/L (ref 0–44)
AST: 15 U/L (ref 15–41)
Albumin: 3.8 g/dL (ref 3.5–5.0)
Alkaline Phosphatase: 47 U/L (ref 38–126)
Anion gap: 6 (ref 5–15)
BUN: 6 mg/dL (ref 6–20)
CO2: 24 mmol/L (ref 22–32)
Calcium: 9.1 mg/dL (ref 8.9–10.3)
Chloride: 105 mmol/L (ref 98–111)
Creatinine, Ser: 0.6 mg/dL (ref 0.44–1.00)
GFR, Estimated: >60 mL/min (ref >60)
Glucose, Bld: 104 mg/dL — ABNORMAL HIGH (ref 70–99)
Potassium: 3.7 mmol/L (ref 3.5–5.1)
Sodium: 135 mmol/L (ref 135–145)
Total Bilirubin: 1.3 mg/dL — ABNORMAL HIGH (ref 0.3–1.2)
Total Protein: 6.6 g/dL (ref 6.5–8.1)

## 2022-01-31 LAB — WET PREP, GENITAL
Clue Cells Wet Prep HPF POC: NONE SEEN
Sperm: NONE SEEN
Trich, Wet Prep: NONE SEEN
WBC, Wet Prep HPF POC: >=10 — AB (ref <10)
Yeast Wet Prep HPF POC: NONE SEEN

## 2022-01-31 LAB — POCT PREGNANCY, URINE: Preg Test, Ur: POSITIVE — AB

## 2022-01-31 LAB — HCG, QUANTITATIVE, PREGNANCY: hCG, Beta Chain, Quant, S: 24382 m[IU]/mL — ABNORMAL HIGH (ref <5)

## 2022-01-31 NOTE — MAU Note (Signed)
Natalie Barnes is a 31 y.o. at [redacted]w[redacted]d here in MAU reporting: VB that began this morning @ 0500.  Reports was seen in OB yesterday, had intravaginal ultrasound with +FHR and noted some cramping afterwards.  States cramping worsened throughout the day yesterday ?LMP: 12/08/2021 ?Onset of complaint: today 01/31/22 ?Pain score: 2 ?Vitals:  ? 01/31/22 0939  ?BP: 115/72  ?Pulse: 94  ?Resp: 19  ?Temp: 97.6 ?F (36.4 ?C)  ?SpO2: 100%  ?   ?FHT: NA ?Lab orders placed from triage:   UPY & UA ?

## 2022-01-31 NOTE — MAU Provider Note (Signed)
?History  ?  ? ?CSN: [id number] ? ?Arrival date and time: 01/31/22 [id number] ? ? Event Date/Time  ? First Provider Initiated Contact with Patient 01/31/22 [phone]   ?  ? ?Chief Complaint  ?Patient presents with  ? Vaginal Bleeding  ? ?Ms. Natalie Barnes is a 31 y.o. G2P1001 at 4744w5d who presents to MAU for vaginal bleeding which began this morning around 5AM. Patient states she passed a few clots and was bleeding like a period. Patient is also experiencing cramping. Per CareEverywhere and per pt, she was seen by OB/GYN yesterday and had a US which noted and IUP at 8529w4d with FHT. Patient is concerned she is having a miscarriage. Patient has a f/u appt scheduled with her OB/GYN on Thursday for a repeat US as she was measuring 1 week behind the dates compared to her period. ? ?Pt denies vaginal discharge/odor/itching. ?Pt denies N/V, abdominal pain, constipation, diarrhea, or urinary problems. ?Pt denies fever, chills, fatigue, sweating or changes in appetite. ?Pt denies SOB or chest pain. ?Pt denies dizziness, HA, light-headedness, weakness. ? ? ?OB History   ? ? Gravida  ?2  ? Para  ?1  ? Term  ?1  ? Preterm  ?   ? AB  ?   ? Living  ?1  ?  ? ? SAB  ?   ? IAB  ?   ? Ectopic  ?   ? Multiple  ?   ? Live Births  ?1  ?   ?  ?  ? ? ?Past Medical History:  ?Diagnosis Date  ? Gestational diabetes   ? ? ?Past Surgical History:  ?Procedure Laterality Date  ? NO PAST SURGERIES    ? ? ?Family History  ?Problem Relation Age of Onset  ? Diabetes Father   ? ? ?Social History  ? ?Tobacco Use  ? Smoking status: Never  ? Smokeless tobacco: Never  ?Vaping Use  ? Vaping Use: Never used  ?Substance Use Topics  ? Alcohol use: Never  ? Drug use: Never  ? ? ?Allergies:  ?Allergies  ?Allergen Reactions  ? Penicillins Hives, Diarrhea, Nausea Only and Rash  ? ? ?Medications Prior to Admission  ?Medication Sig Dispense Refill Last Dose  ? Prenatal Vit-Fe Fumarate-FA (PRENATAL MULTIVITAMIN) TABS tablet Take 1 tablet by mouth daily at 12 noon.   01/30/2022  ?  guaiFENesin (MUCINEX) 600 MG 12 hr tablet Take 1 tablet (600 mg total) by mouth 2 (two) times daily. 60 tablet 1   ? ? ?Review of Systems  ?Constitutional:  Negative for chills, diaphoresis, fatigue and fever.  ?Eyes:  Negative for visual disturbance.  ?Respiratory:  Negative for shortness of breath.   ?Cardiovascular:  Negative for chest pain.  ?Gastrointestinal:  Negative for abdominal pain, constipation, diarrhea, nausea and vomiting.  ?Genitourinary:  Positive for pelvic pain and vaginal bleeding. Negative for dysuria, flank pain, frequency, urgency and vaginal discharge.  ?Neurological:  Negative for dizziness, weakness, light-headedness and headaches.  ? ?Physical Exam  ? ?Blood pressure 115/72, pulse 94, temperature 97.6 ?F (36.4 ?C), temperature source Oral, resp. rate 19, height 5\' 3"  (1.6 m), weight 67.8 kg, last menstrual period 12/08/2021, SpO2 100 %, unknown if currently breastfeeding. ? ?Patient Vitals for the past 24 hrs: ? BP Temp Temp src Pulse Resp SpO2 Height Weight  ?01/31/22 0939 115/72 97.6 ?F (36.4 ?C) Oral 94 19 100 % -- --  ?01/31/22 0931 -- -- -- -- -- -- 5\' 3"  (1.6 m) 67.8 kg  ? ?  Physical Exam ?Vitals and nursing note reviewed.  ?Constitutional:   ?   General: She is not in acute distress. ?   Appearance: Normal appearance. She is not ill-appearing, toxic-appearing or diaphoretic.  ?HENT:  ?   Head: Normocephalic and atraumatic.  ?Pulmonary:  ?   Effort: Pulmonary effort is normal.  ?Neurological:  ?   Mental Status: She is alert and oriented to person, place, and time.  ?Psychiatric:     ?   Mood and Affect: Mood normal.     ?   Behavior: Behavior normal.     ?   Thought Content: Thought content normal.     ?   Judgment: Judgment normal.  ? ?Results for orders placed or performed during the hospital encounter of 01/31/22 (from the past 24 hour(s))  ?Urinalysis, Routine w reflex microscopic     Status: Abnormal  ? Collection Time: 01/31/22  9:27 AM  ?Result Value Ref Range  ? Color, Urine  STRAW (A) YELLOW  ? APPearance CLEAR CLEAR  ? Specific Gravity, Urine 1.002 (L) 1.005 - 1.030  ? pH 7.0 5.0 - 8.0  ? Glucose, UA NEGATIVE NEGATIVE mg/dL  ? Hgb urine dipstick LARGE (A) NEGATIVE  ? Bilirubin Urine NEGATIVE NEGATIVE  ? Ketones, ur NEGATIVE NEGATIVE mg/dL  ? Protein, ur NEGATIVE NEGATIVE mg/dL  ? Nitrite NEGATIVE NEGATIVE  ? Leukocytes,Ua NEGATIVE NEGATIVE  ? RBC / HPF 0-5 0 - 5 RBC/hpf  ? WBC, UA 0-5 0 - 5 WBC/hpf  ? Bacteria, UA RARE (A) NONE SEEN  ? Squamous Epithelial / LPF 0-5 0 - 5  ? Mucus PRESENT   ?Pregnancy, urine POC     Status: Abnormal  ? Collection Time: 01/31/22  9:33 AM  ?Result Value Ref Range  ? Preg Test, Ur POSITIVE (A) NEGATIVE  ?Wet prep, genital     Status: Abnormal  ? Collection Time: 01/31/22 10:04 AM  ? Specimen: PATH Cytology Cervicovaginal Ancillary Only  ?Result Value Ref Range  ? Yeast Wet Prep HPF POC NONE SEEN NONE SEEN  ? Trich, Wet Prep NONE SEEN NONE SEEN  ? Clue Cells Wet Prep HPF POC NONE SEEN NONE SEEN  ? WBC, Wet Prep HPF POC >=10 (A) <10  ? Sperm NONE SEEN   ?CBC with Differential/Platelet     Status: Abnormal  ? Collection Time: 01/31/22 10:09 AM  ?Result Value Ref Range  ? WBC 20.3 (H) 4.0 - 10.5 K/uL  ? RBC 4.86 3.87 - 5.11 MIL/uL  ? Hemoglobin 13.2 12.0 - 15.0 g/dL  ? HCT 39.8 36.0 - 46.0 %  ? MCV 81.9 80.0 - 100.0 fL  ? MCH 27.2 26.0 - 34.0 pg  ? MCHC 33.2 30.0 - 36.0 g/dL  ? RDW 12.6 11.5 - 15.5 %  ? Platelets 328 150 - 400 K/uL  ? nRBC 0.0 0.0 - 0.2 %  ? Neutrophils Relative % 82 %  ? Neutro Abs 16.7 (H) 1.7 - 7.7 K/uL  ? Lymphocytes Relative 12 %  ? Lymphs Abs 2.4 0.7 - 4.0 K/uL  ? Monocytes Relative 5 %  ? Monocytes Absolute 1.1 (H) 0.1 - 1.0 K/uL  ? Eosinophils Relative 0 %  ? Eosinophils Absolute 0.1 0.0 - 0.5 K/uL  ? Basophils Relative 0 %  ? Basophils Absolute 0.1 0.0 - 0.1 K/uL  ? Immature Granulocytes 1 %  ? Abs Immature Granulocytes 0.12 (H) 0.00 - 0.07 K/uL  ?Comprehensive metabolic panel     Status: Abnormal  ? Collection  Time: 01/31/22 10:09 AM   ?Result Value Ref Range  ? Sodium 135 135 - 145 mmol/L  ? Potassium 3.7 3.5 - 5.1 mmol/L  ? Chloride 105 98 - 111 mmol/L  ? CO2 24 22 - 32 mmol/L  ? Glucose, Bld 104 (H) 70 - 99 mg/dL  ? BUN 6 6 - 20 mg/dL  ? Creatinine, Ser 0.60 0.44 - 1.00 mg/dL  ? Calcium 9.1 8.9 - 10.3 mg/dL  ? Total Protein 6.6 6.5 - 8.1 g/dL  ? Albumin 3.8 3.5 - 5.0 g/dL  ? AST 15 15 - 41 U/L  ? ALT 15 0 - 44 U/L  ? Alkaline Phosphatase 47 38 - 126 U/L  ? Total Bilirubin 1.3 (H) 0.3 - 1.2 mg/dL  ? GFR, Estimated >60 >60 mL/min  ? Anion gap 6 5 - 15  ?hCG, quantitative, pregnancy     Status: Abnormal  ? Collection Time: 01/31/22 10:09 AM  ?Result Value Ref Range  ? hCG, Beta Chain, Quant, S 24,382 (H) <5 mIU/mL  ? ?US OB Transvaginal ? ?Result Date: 01/31/2022 ?CLINICAL DATA:  Pelvic pain and vaginal bleeding in 1st trimester pregnancy. EXAM: TRANSVAGINAL OB ULTRASOUND TECHNIQUE: Transvaginal ultrasound was performed for complete evaluation of the gestation as well as the maternal uterus, adnexal regions, and pelvic cul-de-sac. COMPARISON:  None. FINDINGS: Intrauterine gestational sac: None Maternal uterus/adnexae: Endometrium shows heterogeneous echogenicity and measures 20 mm in thickness. No fibroids identified. Both ovaries are normal in appearance. No adnexal mass or abnormal free fluid identified. IMPRESSION: Pregnancy of unknown anatomic location (no intrauterine gestational sac or adnexal mass identified). Differential diagnosis includes recent spontaneous abortion, IUP too early to visualize, and non-visualized ectopic pregnancy. Recommend correlation with serial beta-hCG levels, and follow up US if warranted clinically. Electronically Signed   By: Danae Orleans M.D.   On: 01/31/2022 11:26   ? ?MAU Course  ?Procedures ? ?MDM ?-r/o SAB ?-UA: straw/SG 1.002/lg hgb/rare bacteria, sending urine for culture ?-CBC: no abnormalities requiring treatment ?-CMP: no abnormalities requiring treatment ?-[place]: no IUP visualized, SAB ?-hCG:  53,748 ?-ABO: A Positive ?-WetPrep: WNL ?-GC/CT collected ?-pt discharged to home in stable condition ? ? ?99204/99214 (OP) / [phone] (MAU) ? ?Billing is supported by: ? ?This being 1 undiagnosed new problem with u

## 2022-02-01 LAB — CULTURE, OB URINE: Culture: <10000 — AB

## 2022-02-02 LAB — GC/CHLAMYDIA PROBE AMP (~~LOC~~) NOT AT ARMC
Chlamydia: NEGATIVE
Comment: NEGATIVE
Comment: NORMAL
Neisseria Gonorrhea: NEGATIVE

## 2022-02-06 DIAGNOSIS — O039 Complete or unspecified spontaneous abortion without complication: Secondary | ICD-10-CM | POA: Diagnosis not present

## 2022-02-06 DIAGNOSIS — O021 Missed abortion: Secondary | ICD-10-CM | POA: Diagnosis not present

## 2022-02-11 DIAGNOSIS — O021 Missed abortion: Secondary | ICD-10-CM | POA: Diagnosis not present

## 2022-02-11 DIAGNOSIS — N939 Abnormal uterine and vaginal bleeding, unspecified: Secondary | ICD-10-CM | POA: Diagnosis not present

## 2022-02-24 DIAGNOSIS — J40 Bronchitis, not specified as acute or chronic: Secondary | ICD-10-CM | POA: Diagnosis not present

## 2022-03-06 DIAGNOSIS — O021 Missed abortion: Secondary | ICD-10-CM | POA: Diagnosis not present

## 2022-05-05 DIAGNOSIS — N912 Amenorrhea, unspecified: Secondary | ICD-10-CM | POA: Diagnosis not present

## 2022-05-07 DIAGNOSIS — N912 Amenorrhea, unspecified: Secondary | ICD-10-CM | POA: Diagnosis not present

## 2022-05-11 DIAGNOSIS — N911 Secondary amenorrhea: Secondary | ICD-10-CM | POA: Diagnosis not present

## 2022-05-19 DIAGNOSIS — N911 Secondary amenorrhea: Secondary | ICD-10-CM | POA: Diagnosis not present

## 2022-05-29 DIAGNOSIS — Z3685 Encounter for antenatal screening for Streptococcus B: Secondary | ICD-10-CM | POA: Diagnosis not present

## 2022-05-29 DIAGNOSIS — Z3481 Encounter for supervision of other normal pregnancy, first trimester: Secondary | ICD-10-CM | POA: Diagnosis not present

## 2022-05-29 LAB — OB RESULTS CONSOLE RPR: RPR: NONREACTIVE

## 2022-05-29 LAB — OB RESULTS CONSOLE HEPATITIS B SURFACE ANTIGEN: Hepatitis B Surface Ag: NEGATIVE

## 2022-05-29 LAB — OB RESULTS CONSOLE ANTIBODY SCREEN: Antibody Screen: NEGATIVE

## 2022-05-29 LAB — HEPATITIS C ANTIBODY: HCV Ab: NEGATIVE

## 2022-05-29 LAB — OB RESULTS CONSOLE RUBELLA ANTIBODY, IGM: Rubella: IMMUNE

## 2022-05-29 LAB — OB RESULTS CONSOLE ABO/RH: RH Type: POSITIVE

## 2022-05-29 LAB — OB RESULTS CONSOLE HIV ANTIBODY (ROUTINE TESTING): HIV: NONREACTIVE

## 2022-06-10 DIAGNOSIS — Z3A10 10 weeks gestation of pregnancy: Secondary | ICD-10-CM | POA: Diagnosis not present

## 2022-06-10 DIAGNOSIS — Z3481 Encounter for supervision of other normal pregnancy, first trimester: Secondary | ICD-10-CM | POA: Diagnosis not present

## 2022-06-10 DIAGNOSIS — Z124 Encounter for screening for malignant neoplasm of cervix: Secondary | ICD-10-CM | POA: Diagnosis not present

## 2022-06-10 DIAGNOSIS — Z113 Encounter for screening for infections with a predominantly sexual mode of transmission: Secondary | ICD-10-CM | POA: Diagnosis not present

## 2022-06-11 LAB — OB RESULTS CONSOLE GC/CHLAMYDIA
Chlamydia: NEGATIVE
Neisseria Gonorrhea: NEGATIVE

## 2022-06-24 DIAGNOSIS — Z3A12 12 weeks gestation of pregnancy: Secondary | ICD-10-CM | POA: Diagnosis not present

## 2022-06-24 DIAGNOSIS — Z3682 Encounter for antenatal screening for nuchal translucency: Secondary | ICD-10-CM | POA: Diagnosis not present

## 2022-07-01 LAB — OB RESULTS CONSOLE GC/CHLAMYDIA: Chlamydia: NEGATIVE

## 2022-08-11 DIAGNOSIS — Z363 Encounter for antenatal screening for malformations: Secondary | ICD-10-CM | POA: Diagnosis not present

## 2022-08-11 DIAGNOSIS — Z3A19 19 weeks gestation of pregnancy: Secondary | ICD-10-CM | POA: Diagnosis not present

## 2022-08-11 DIAGNOSIS — Z361 Encounter for antenatal screening for raised alphafetoprotein level: Secondary | ICD-10-CM | POA: Diagnosis not present

## 2022-09-08 ENCOUNTER — Telehealth (HOSPITAL_COMMUNITY): Payer: Self-pay

## 2022-09-08 NOTE — Telephone Encounter (Signed)
Preadmission screening 

## 2022-09-15 DIAGNOSIS — Z3A24 24 weeks gestation of pregnancy: Secondary | ICD-10-CM | POA: Diagnosis not present

## 2022-09-15 DIAGNOSIS — Z8632 Personal history of gestational diabetes: Secondary | ICD-10-CM | POA: Diagnosis not present

## 2022-09-15 DIAGNOSIS — Z362 Encounter for other antenatal screening follow-up: Secondary | ICD-10-CM | POA: Diagnosis not present

## 2022-09-25 DIAGNOSIS — O99810 Abnormal glucose complicating pregnancy: Secondary | ICD-10-CM | POA: Diagnosis not present

## 2022-10-21 DIAGNOSIS — Z23 Encounter for immunization: Secondary | ICD-10-CM | POA: Diagnosis not present

## 2022-10-21 DIAGNOSIS — Z3480 Encounter for supervision of other normal pregnancy, unspecified trimester: Secondary | ICD-10-CM | POA: Diagnosis not present

## 2022-11-09 NOTE — L&D Delivery Note (Signed)
Delivery Note At 7:52 AM a viable female was delivered via Vaginal, Spontaneous (Presentation: LOA).  APGAR: pend, ; weight pend .   Placenta status: Spontaneous, Intact.  Cord:   with the following complications:   Uterus cleared of clot and debris  Anesthesia:  Epidural Episiotomy:  None Lacerations:  Periurethral, hemostatic Suture Repair: n/a Est. Blood Loss (mL):  167 cc  Mom to postpartum.  Baby to Couplet care / Skin to Skin.  Carlyon Shadow 01/05/2023, 8:07 AM

## 2022-11-24 DIAGNOSIS — Z3A34 34 weeks gestation of pregnancy: Secondary | ICD-10-CM | POA: Diagnosis not present

## 2022-11-24 DIAGNOSIS — O98513 Other viral diseases complicating pregnancy, third trimester: Secondary | ICD-10-CM | POA: Diagnosis not present

## 2022-11-24 DIAGNOSIS — B349 Viral infection, unspecified: Secondary | ICD-10-CM | POA: Diagnosis not present

## 2022-12-08 DIAGNOSIS — Z3685 Encounter for antenatal screening for Streptococcus B: Secondary | ICD-10-CM | POA: Diagnosis not present

## 2022-12-08 LAB — OB RESULTS CONSOLE GBS: GBS: NEGATIVE

## 2022-12-30 ENCOUNTER — Telehealth (HOSPITAL_COMMUNITY): Payer: Self-pay | Admitting: *Deleted

## 2022-12-30 ENCOUNTER — Encounter (HOSPITAL_COMMUNITY): Payer: Self-pay | Admitting: *Deleted

## 2022-12-30 NOTE — Telephone Encounter (Signed)
Preadmission screen  

## 2023-01-04 DIAGNOSIS — Z3A41 41 weeks gestation of pregnancy: Secondary | ICD-10-CM | POA: Diagnosis not present

## 2023-01-04 DIAGNOSIS — O99891 Other specified diseases and conditions complicating pregnancy: Secondary | ICD-10-CM | POA: Diagnosis not present

## 2023-01-05 ENCOUNTER — Inpatient Hospital Stay (HOSPITAL_COMMUNITY): Payer: BC Managed Care – PPO | Admitting: Anesthesiology

## 2023-01-05 ENCOUNTER — Encounter (HOSPITAL_COMMUNITY): Payer: Self-pay | Admitting: Obstetrics and Gynecology

## 2023-01-05 ENCOUNTER — Other Ambulatory Visit: Payer: Self-pay

## 2023-01-05 ENCOUNTER — Inpatient Hospital Stay (HOSPITAL_COMMUNITY)
Admission: AD | Admit: 2023-01-05 | Discharge: 2023-01-06 | DRG: 807 | Disposition: A | Discharge status: Home / Self Care | Payer: BC Managed Care – PPO | Attending: Obstetrics and Gynecology | Admitting: Obstetrics and Gynecology

## 2023-01-05 DIAGNOSIS — Z3A40 40 weeks gestation of pregnancy: Secondary | ICD-10-CM | POA: Diagnosis not present

## 2023-01-05 DIAGNOSIS — O480 Post-term pregnancy: Secondary | ICD-10-CM | POA: Diagnosis not present

## 2023-01-05 LAB — CBC
HCT: 38.6 % (ref 36.0–46.0)
Hemoglobin: 12.6 g/dL (ref 12.0–15.0)
MCH: 27.6 pg (ref 26.0–34.0)
MCHC: 32.6 g/dL (ref 30.0–36.0)
MCV: 84.5 fL (ref 80.0–100.0)
Platelets: 281 10*3/uL (ref 150–400)
RBC: 4.57 MIL/uL (ref 3.87–5.11)
RDW: 13.9 % (ref 11.5–15.5)
WBC: 17.6 10*3/uL — ABNORMAL HIGH (ref 4.0–10.5)
nRBC: 0 % (ref 0.0–0.2)

## 2023-01-05 LAB — TYPE AND SCREEN
ABO/RH(D): A POS
Antibody Screen: NEGATIVE

## 2023-01-05 LAB — HIV ANTIBODY (ROUTINE TESTING W REFLEX): HIV Screen 4th Generation wRfx: NONREACTIVE

## 2023-01-05 LAB — RPR: RPR Ser Ql: NONREACTIVE

## 2023-01-05 MED ORDER — FLEET ENEMA 7-19 GM/118ML RE ENEM: 1.0000 | ENEMA | RECTAL | Status: DC | PRN

## 2023-01-05 MED ORDER — LACTATED RINGERS IV SOLN: 500.0000 mL | INTRAVENOUS | Status: DC | PRN

## 2023-01-05 MED ORDER — TETANUS-DIPHTH-ACELL PERTUSSIS 5-2.5-18.5 LF-MCG/0.5 IM SUSY: 0.5000 mL | PREFILLED_SYRINGE | Freq: Once | INTRAMUSCULAR | Status: DC

## 2023-01-05 MED ORDER — LIDOCAINE HCL (PF) 1 % IJ SOLN
INTRAMUSCULAR | Status: DC | PRN
  Administered 2023-01-05: 2 mL via EPIDURAL
  Administered 2023-01-05: 10 mL via EPIDURAL

## 2023-01-05 MED ORDER — LACTATED RINGERS IV SOLN: INTRAVENOUS | Status: DC

## 2023-01-05 MED ORDER — DIPHENHYDRAMINE HCL 50 MG/ML IJ SOLN: 12.5000 mg | INTRAMUSCULAR | Status: DC | PRN

## 2023-01-05 MED ORDER — EPHEDRINE 5 MG/ML INJ: 10.0000 mg | INTRAVENOUS | Status: DC | PRN

## 2023-01-05 MED ORDER — ACETAMINOPHEN 325 MG PO TABS: 650.0000 mg | ORAL_TABLET | ORAL | Status: DC | PRN

## 2023-01-05 MED ORDER — ONDANSETRON HCL 4 MG/2ML IJ SOLN: 4.0000 mg | Freq: Four times a day (QID) | INTRAMUSCULAR | Status: DC | PRN

## 2023-01-05 MED ORDER — IBUPROFEN 600 MG PO TABS
600.0000 mg | ORAL_TABLET | Freq: Four times a day (QID) | ORAL | Status: DC
  Administered 2023-01-05 – 2023-01-06 (×5): 600 mg via ORAL
  Filled 2023-01-05 (×4): qty 1

## 2023-01-05 MED ORDER — SENNOSIDES-DOCUSATE SODIUM 8.6-50 MG PO TABS
2.0000 | ORAL_TABLET | ORAL | Status: DC
  Administered 2023-01-05 – 2023-01-06 (×2): 2 via ORAL
  Filled 2023-01-05: qty 2

## 2023-01-05 MED ORDER — LACTATED RINGERS IV SOLN
500.0000 mL | Freq: Once | INTRAVENOUS | Status: AC
  Administered 2023-01-05: 500 mL via INTRAVENOUS

## 2023-01-05 MED ORDER — DIPHENHYDRAMINE HCL 25 MG PO CAPS: 25.0000 mg | ORAL_CAPSULE | Freq: Four times a day (QID) | ORAL | Status: DC | PRN

## 2023-01-05 MED ORDER — ZOLPIDEM TARTRATE 5 MG PO TABS: 5.0000 mg | ORAL_TABLET | Freq: Every evening | ORAL | Status: DC | PRN

## 2023-01-05 MED ORDER — LIDOCAINE HCL (PF) 1 % IJ SOLN: 30.0000 mL | INTRAMUSCULAR | Status: DC | PRN

## 2023-01-05 MED ORDER — OXYCODONE-ACETAMINOPHEN 5-325 MG PO TABS: 2.0000 | ORAL_TABLET | ORAL | Status: DC | PRN

## 2023-01-05 MED ORDER — OXYCODONE-ACETAMINOPHEN 5-325 MG PO TABS: 1.0000 | ORAL_TABLET | ORAL | Status: DC | PRN

## 2023-01-05 MED ORDER — TERBUTALINE SULFATE 1 MG/ML IJ SOLN: 0.2500 mg | Freq: Once | INTRAMUSCULAR | Status: DC | PRN

## 2023-01-05 MED ORDER — FENTANYL-BUPIVACAINE-NACL 0.5-0.125-0.9 MG/250ML-% EP SOLN
12.0000 mL/h | EPIDURAL | Status: DC | PRN
  Filled 2023-01-05: qty 250

## 2023-01-05 MED ORDER — OXYTOCIN-SODIUM CHLORIDE 30-0.9 UT/500ML-% IV SOLN
2.5000 [IU]/h | INTRAVENOUS | Status: DC
  Administered 2023-01-05: 2.5 [IU]/h via INTRAVENOUS

## 2023-01-05 MED ORDER — BENZOCAINE-MENTHOL 20-0.5 % EX AERO: 1.0000 | INHALATION_SPRAY | CUTANEOUS | Status: DC | PRN

## 2023-01-05 MED ORDER — FENTANYL-BUPIVACAINE-NACL 0.5-0.125-0.9 MG/250ML-% EP SOLN
EPIDURAL | Status: DC | PRN
  Administered 2023-01-05: 12 mL/h via EPIDURAL

## 2023-01-05 MED ORDER — DIBUCAINE (PERIANAL) 1 % EX OINT: 1.0000 | TOPICAL_OINTMENT | CUTANEOUS | Status: DC | PRN

## 2023-01-05 MED ORDER — WITCH HAZEL-GLYCERIN EX PADS: 1.0000 | MEDICATED_PAD | CUTANEOUS | Status: DC | PRN

## 2023-01-05 MED ORDER — ACETAMINOPHEN 325 MG PO TABS
650.0000 mg | ORAL_TABLET | ORAL | Status: DC | PRN
  Administered 2023-01-05: 650 mg via ORAL
  Filled 2023-01-05: qty 2

## 2023-01-05 MED ORDER — PRENATAL MULTIVITAMIN CH
1.0000 | ORAL_TABLET | Freq: Every day | ORAL | Status: DC
  Administered 2023-01-05 – 2023-01-06 (×2): 1 via ORAL
  Filled 2023-01-05: qty 1

## 2023-01-05 MED ORDER — OXYTOCIN-SODIUM CHLORIDE 30-0.9 UT/500ML-% IV SOLN
1.0000 m[IU]/min | INTRAVENOUS | Status: DC
  Administered 2023-01-05: 2 m[IU]/min via INTRAVENOUS
  Filled 2023-01-05: qty 500

## 2023-01-05 MED ORDER — ONDANSETRON HCL 4 MG PO TABS: 4.0000 mg | ORAL_TABLET | ORAL | Status: DC | PRN

## 2023-01-05 MED ORDER — FAMOTIDINE 20 MG PO TABS
20.0000 mg | ORAL_TABLET | Freq: Two times a day (BID) | ORAL | Status: DC
  Filled 2023-01-05: qty 1

## 2023-01-05 MED ORDER — SOD CITRATE-CITRIC ACID 500-334 MG/5ML PO SOLN: 30.0000 mL | ORAL | Status: DC | PRN

## 2023-01-05 MED ORDER — COCONUT OIL OIL: 1.0000 | TOPICAL_OIL | Status: DC | PRN

## 2023-01-05 MED ORDER — PHENYLEPHRINE 80 MCG/ML (10ML) SYRINGE FOR IV PUSH (FOR BLOOD PRESSURE SUPPORT): 80.0000 ug | PREFILLED_SYRINGE | INTRAVENOUS | Status: DC | PRN

## 2023-01-05 MED ORDER — SIMETHICONE 80 MG PO CHEW: 80.0000 mg | CHEWABLE_TABLET | ORAL | Status: DC | PRN

## 2023-01-05 MED ORDER — OXYTOCIN BOLUS FROM INFUSION
333.0000 mL | Freq: Once | INTRAVENOUS | Status: AC
  Administered 2023-01-05: 333 mL via INTRAVENOUS

## 2023-01-05 MED ORDER — PHENYLEPHRINE 80 MCG/ML (10ML) SYRINGE FOR IV PUSH (FOR BLOOD PRESSURE SUPPORT)
80.0000 ug | PREFILLED_SYRINGE | INTRAVENOUS | Status: DC | PRN
  Filled 2023-01-05: qty 10

## 2023-01-05 MED ORDER — ONDANSETRON HCL 4 MG/2ML IJ SOLN: 4.0000 mg | INTRAMUSCULAR | Status: DC | PRN

## 2023-01-05 NOTE — Anesthesia Preprocedure Evaluation (Signed)
Anesthesia Evaluation  Patient identified by MRN, date of birth, ID band Patient awake    Reviewed: Allergy & Precautions, Patient's Chart, lab work & pertinent test results  Airway Mallampati: II  TM Distance: >3 FB Neck ROM: Full    Dental no notable dental hx.    Pulmonary neg pulmonary ROS   Pulmonary exam normal breath sounds clear to auscultation       Cardiovascular negative cardio ROS Normal cardiovascular exam Rhythm:Regular Rate:Normal     Neuro/Psych negative neurological ROS  negative psych ROS   GI/Hepatic negative GI ROS, Neg liver ROS,,,  Endo/Other  negative endocrine ROS    Renal/GU negative Renal ROS  negative genitourinary   Musculoskeletal negative musculoskeletal ROS (+)    Abdominal   Peds negative pediatric ROS (+)  Hematology negative hematology ROS (+) Hb 12.6, plt 281   Anesthesia Other Findings   Reproductive/Obstetrics (+) Pregnancy                             Anesthesia Physical Anesthesia Plan  ASA: 2  Anesthesia Plan: Epidural   Post-op Pain Management:    Induction:   PONV Risk Score and Plan: 2  Airway Management Planned: Natural Airway  Additional Equipment: None  Intra-op Plan:   Post-operative Plan:   Informed Consent: I have reviewed the patients History and Physical, chart, labs and discussed the procedure including the risks, benefits and alternatives for the proposed anesthesia with the patient or authorized representative who has indicated his/her understanding and acceptance.       Plan Discussed with:   Anesthesia Plan Comments:        Anesthesia Quick Evaluation

## 2023-01-05 NOTE — H&P (Signed)
OB History and Physical   Natalie Barnes is a 32 y.o. female G3P1011 presenting for contractions at [redacted]w[redacted]d cervix dilated 5.5/50/-2.  Reports good fetal movement, denies LOF or VB.  Pregnancy has been uncomplicated. She has a history of LEEP procedure.  Rh positive, GBS negative 12/08/22, panorama low risk.    OB History     Gravida  3   Para  1   Term  1   Preterm      AB      Living  1      SAB      IAB      Ectopic      Multiple      Live Births  1          Past Medical History:  Diagnosis Date   Gestational diabetes    Past Surgical History:  Procedure Laterality Date   NO PAST SURGERIES     Family History: family history includes Diabetes in her father. Social History:  reports that she has never smoked. She has never used smokeless tobacco. She reports that she does not drink alcohol and does not use drugs.     Maternal Diabetes: No Genetic Screening: Normal Maternal Ultrasounds/Referrals: Normal Fetal Ultrasounds or other Referrals:  None Maternal Substance Abuse:  No Significant Maternal Medications:  None Significant Maternal Lab Results:  Group B Strep negative Other Comments:  None  Review of Systems - Patient denies fever, chills, SOB, CP, N/V/D.  History Dilation: 5.5 Effacement (%): 50 Station: -2 Exam by:: AOsvaldo HumanRN Blood pressure 135/87, pulse 74, temperature 98.1 F (36.7 C), temperature source Oral, resp. rate 19, height '5\' 3"'$  (1.6 m), weight 90 kg, last menstrual period 12/08/2021, SpO2 100 %, unknown if currently breastfeeding. Exam Physical Exam   Gen: alert, well appearing, no distress Chest: nonlabored breathing CV: no peripheral edema Abdomen: gravid, ctx present Ext: no evidence of DVT  FHT: reactive and reassuring  Prenatal labs: ABO, Rh: --/--/PENDING (02/27 0059) Antibody: PENDING (02/27 0059) Rubella: Immune (07/21 0000) RPR: Nonreactive (07/21 0000)  HBsAg: Negative (07/21 0000)  HIV: Non-reactive  (07/21 0000)  GBS: Negative/-- (01/30 0000)   Assessment/Plan: Admit to Labor and Delivery AROM and augment with pit as needed Epidural when desired Anticipate vaginal delivery   TCarlyon Shadow2/27/2024, 1:34 AM

## 2023-01-05 NOTE — Lactation Note (Signed)
This note was copied from a baby's chart. Lactation Consultation Note  Patient Name: Natalie Barnes Today's Date: 01/05/2023 Age:33 hours Mom requested to see Lactation d/t baby will not eat. Mom holding baby STS on her chest and she was rooting on her chest and screaming. They tried to give her a bottle w/formula. Baby took 5 ml about hr ago then got hiccups. Mom plans to pump and bottle feed d/t having a toddler at home and having to go back to work pumping works best for mom. Mom not understanding why baby isn't liking the bottle right now. The baby has taken 2 feedings from bottle well. Baby lad her bath and had a large spit up of emesis during the bath. Explained babies often aren't very hungry the first day and if they have a lot of fluid in their tummy they may not feel like eating until that stuff is out by spitting up or popping out. Mom stated understanding.  Mom's 1st child wouldn't BF or take bottle refusing to eat and had a lot of swallowing issues, dysphagia. They are worried this child will have the same thing. Baby does appear the may have posterior tight frenulum when she cries but shouldn't affect feeding from bottle. Mom stated they had her 1st child's fixed and it didn't help w/feedings. Suggested to feed w/cues and every 3 three hours. But formula get on a schedule every 3 if they have a good feeding.  Reason for consult: Mother's request;Term   Maternal Data    Feeding Mother's Current Feeding Choice: Breast Milk and Formula Nipple Type: Nfant Slow Flow (purple)  LATCH Score                    Lactation Tools Discussed/Used    Interventions    Discharge Pump: DEBP;Personal;Hands Free  Consult Status Consult Status: Complete Date: 01/05/23    Theodoro Kalata 01/05/2023, 9:18 PM

## 2023-01-05 NOTE — Anesthesia Procedure Notes (Signed)
Epidural Patient location during procedure: OB Start time: 01/05/2023 1:49 AM End time: 01/05/2023 1:57 AM  Staffing Anesthesiologist: Pervis Hocking, DO Performed: anesthesiologist   Preanesthetic Checklist Completed: patient identified, IV checked, risks and benefits discussed, monitors and equipment checked, pre-op evaluation and timeout performed  Epidural Patient position: sitting Prep: DuraPrep and site prepped and draped Patient monitoring: continuous pulse ox, blood pressure, heart rate and cardiac monitor Approach: midline Location: L3-L4 Injection technique: LOR air  Needle:  Needle type: Tuohy  Needle gauge: 17 G Needle length: 9 cm Needle insertion depth: 5 cm Catheter type: closed end flexible Catheter size: 19 Gauge Catheter at skin depth: 10 cm Test dose: negative  Assessment Sensory level: T8 Events: blood not aspirated, no cerebrospinal fluid, injection not painful, no injection resistance, no paresthesia and negative IV test  Additional Notes Patient identified. Risks/Benefits/Options discussed with patient including but not limited to bleeding, infection, nerve damage, paralysis, failed block, incomplete pain control, headache, blood pressure changes, nausea, vomiting, reactions to medication both or allergic, itching and postpartum back pain. Confirmed with bedside nurse the patient's most recent platelet count. Confirmed with patient that they are not currently taking any anticoagulation, have any bleeding history or any family history of bleeding disorders. Patient expressed understanding and wished to proceed. All questions were answered. Sterile technique was used throughout the entire procedure. Please see nursing notes for vital signs. Test dose was given through epidural catheter and negative prior to continuing to dose epidural or start infusion. Warning signs of high block given to the patient including shortness of breath, tingling/numbness in  hands, complete motor block, or any concerning symptoms with instructions to call for help. Patient was given instructions on fall risk and not to get out of bed. All questions and concerns addressed with instructions to call with any issues or inadequate analgesia.  Reason for block:procedure for pain

## 2023-01-05 NOTE — Progress Notes (Signed)
Patient comfortable with epidural.  FHT cat 1  Cervix 5.5 / 70 / -2, head well applied.  AROM completed in standard fashion with return of clear fluid.  Continue current management  Alpha Gula

## 2023-01-05 NOTE — Lactation Note (Addendum)
This note was copied from a baby's chart. Lactation Consultation Note  Patient Name: Girl Chantice Colden Today's Date: 01/05/2023  Age:33 hours P2, 40.3 GA  Mother plans to exclusively pump and feed baby by bottle. Mother has been started with a DEBP. She collected a small amount with the first pumping session and none since. Discussed that this is typical and instructed to pump every 3 hours for 15 minutes to induce milk production. Mother is feeding baby formula and will feed expressed milk, as collected.   She reports she has a DEBP and a hands free breast pump at home. Given [facility] handout with information regarding services and support following discharge.   Lactation Tools Discussed/Used  DEBP   Consult Status   complete   Gwenevere Abbot 01/05/2023, 6:21 PM

## 2023-01-05 NOTE — MAU Note (Signed)
.  Natalie Barnes is a 32 y.o. at 24w3dhere in MAU reporting: contractions starting at 1900 every 5 minutes. No VB, LOF and endorses + FM.   Onset of complaint: 1900 Pain score: 6/10 Vitals:   01/05/23 0031  BP: 135/87  Pulse: 74  Resp: 19  Temp: 98.1 F (36.7 C)  SpO2: 100%     FHT:145 Lab orders placed from triage:  labor

## 2023-01-05 NOTE — Progress Notes (Signed)
Pt unable to void after FOLEY removed. Bladder scan >700 mL. I/O catheter 900 mL urine, clear, yellow.

## 2023-01-06 ENCOUNTER — Inpatient Hospital Stay (HOSPITAL_COMMUNITY): Payer: BC Managed Care – PPO

## 2023-01-06 ENCOUNTER — Inpatient Hospital Stay (HOSPITAL_COMMUNITY)
Admission: RE | Admit: 2023-01-06 | Payer: BC Managed Care – PPO | Source: Home / Self Care | Admitting: Obstetrics and Gynecology

## 2023-01-06 LAB — CBC
HCT: 35.1 % — ABNORMAL LOW (ref 36.0–46.0)
Hemoglobin: 11.6 g/dL — ABNORMAL LOW (ref 12.0–15.0)
MCH: 28 pg (ref 26.0–34.0)
MCHC: 33 g/dL (ref 30.0–36.0)
MCV: 84.6 fL (ref 80.0–100.0)
Platelets: 225 10*3/uL (ref 150–400)
RBC: 4.15 MIL/uL (ref 3.87–5.11)
RDW: 14.2 % (ref 11.5–15.5)
WBC: 14.1 10*3/uL — ABNORMAL HIGH (ref 4.0–10.5)
nRBC: 0 % (ref 0.0–0.2)

## 2023-01-06 NOTE — Anesthesia Postprocedure Evaluation (Signed)
Anesthesia Post Note  Patient: Natalee Zick  Procedure(s) Performed: AN AD HOC LABOR EPIDURAL     Patient location during evaluation: Mother Baby Anesthesia Type: Epidural Level of consciousness: awake and alert Pain management: pain level controlled Vital Signs Assessment: post-procedure vital signs reviewed and stable Respiratory status: spontaneous breathing, nonlabored ventilation and respiratory function stable Cardiovascular status: stable Postop Assessment: no headache, no backache and epidural receding Anesthetic complications: no  No notable events documented.  Last Vitals:  Vitals:   01/05/23 2156 01/06/23 0531  BP: 124/81 117/89  Pulse: 85 80  Resp: 16 18  Temp: 36.8 C 36.6 C  SpO2:      Last Pain:  Vitals:   01/06/23 0531  TempSrc: Oral  PainSc: 5    Pain Goal:                   Sandrea Matte

## 2023-01-06 NOTE — Social Work (Signed)
MOB was referred for history of Postpartum anxiety.  * Referral screened out by Clinical Social Worker because none of the following criteria appear to apply:  ~ History of anxiety/depression during this pregnancy, or of post-partum depression following prior delivery.  ~ Diagnosis of anxiety and/or depression within last 3 years OR * MOB's symptoms currently being treated with medication and/or therapy.  Per chart review, MOB diagnosis dates back to 2021, Per OB record no noted concerns during this pregnancy.   Please contact the Clinical Social Worker if needs arise, by MOB request.   Letta Kocher, [place] Social Worker [phone]

## 2023-01-06 NOTE — Discharge Summary (Signed)
Postpartum Discharge Summary     Patient Name: Natalie Barnes DOB: [birth year] MRN: [id number]  Date of admission: 01/05/2023 Delivery date:01/05/2023  Delivering provider: Eyvonne Mechanic A  Date of discharge: 01/06/2023  Admitting diagnosis: Normal labor [O80, Z37.9] Intrauterine pregnancy: [redacted]w[redacted]d    Secondary diagnosis:  Principal Problem:   Normal labor  Additional problems: None    Discharge diagnosis: Term Pregnancy Delivered                                              Post partum procedures: None Augmentation: AROM Complications: None  Hospital course: Onset of Labor With Vaginal Delivery      32y.o. yo G3P2002 at 475w3das admitted in Active Labor on 01/05/2023. Labor course was complicated by None.  Membrane Rupture Time/Date: 2:48 AM ,01/05/2023   Delivery Method:Vaginal, Spontaneous  Episiotomy: None  Lacerations:  None  Patient had a postpartum course complicated by None.  She is ambulating, tolerating a regular diet, passing flatus, and urinating well. Patient is discharged home in stable condition on 01/06/23.  Newborn Data: Birth date:01/05/2023  Birth time:7:52 AM  Gender:Female  Living status:Living  Apgars:8 ,9  Weight:3220 g   Magnesium Sulfate received: No BMZ received: No Rhophylac:N/A MMR:N/A T-DaP:Given prenatally Flu: N/A Transfusion:No  Physical exam  Vitals:   01/05/23 1507 01/05/23 1857 01/05/23 2156 01/06/23 0531  BP: 127/78 133/81 124/81 117/89  Pulse: 78 86 85 80  Resp: '16 16 16 18  '$ Temp: 97.8 F (36.6 C) 98.5 F (36.9 C) 98.3 F (36.8 C) 97.8 F (36.6 C)  TempSrc: Oral Oral Oral Oral  SpO2: 100% 100%    Weight:      Height:       General: alert Lochia: appropriate Uterine Fundus: firm Incision: N/A DVT Evaluation: No evidence of DVT seen on physical exam. Labs: Lab Results  Component Value Date   WBC 14.1 (H) 01/06/2023   HGB 11.6 (L) 01/06/2023   HCT 35.1 (L) 01/06/2023   MCV 84.6 01/06/2023   PLT 225 01/06/2023       Latest Ref Rng & Units 01/31/2022   10:09 AM  CMP  Glucose 70 - 99 mg/dL 104   BUN 6 - 20 mg/dL 6   Creatinine 0.44 - 1.00 mg/dL 0.60   Sodium 135 - 145 mmol/L 135   Potassium 3.5 - 5.1 mmol/L 3.7   Chloride 98 - 111 mmol/L 105   CO2 22 - 32 mmol/L 24   Calcium 8.9 - 10.3 mg/dL 9.1   Total Protein 6.5 - 8.1 g/dL 6.6   Total Bilirubin 0.3 - 1.2 mg/dL 1.3   Alkaline Phos 38 - 126 U/L 47   AST 15 - 41 U/L 15   ALT 0 - 44 U/L 15    Edinburgh Score:    01/05/2023   11:00 PM  Edinburgh Postnatal Depression Scale Screening Tool  I have been able to laugh and see the funny side of things. 0  I have looked forward with enjoyment to things. 0  I have blamed myself unnecessarily when things went wrong. 2  I have been anxious or worried for no good reason. 2  I have felt scared or panicky for no good reason. 1  Things have been getting on top of me. 0  I have been so unhappy that I have had difficulty sleeping.  0  I have felt sad or miserable. 0  I have been so unhappy that I have been crying. 0  The thought of harming myself has occurred to me. 0  Edinburgh Postnatal Depression Scale Total 5      After visit meds:  Allergies as of 01/06/2023       Reactions   Penicillins Hives, Diarrhea, Nausea Only, Rash        Medication List     STOP taking these medications    guaiFENesin 600 MG 12 hr tablet Commonly known as: Mucinex       TAKE these medications    famotidine 20 MG tablet Commonly known as: PEPCID Take 20 mg by mouth 2 (two) times daily.   prenatal multivitamin Tabs tablet Take 1 tablet by mouth daily at 12 noon.         Discharge home in stable condition Infant Feeding: Bottle and Breast Infant Disposition:home with mother Discharge instruction: per After Visit Summary and Postpartum booklet. Activity: Advance as tolerated. Pelvic rest for 6 weeks.  Diet: routine diet Anticipated Birth Control: Unsure Postpartum Appointment:6  weeks Additional Postpartum F/U:  None Future Appointments:No future appointments. Follow up Visit:      01/06/2023 Tyson Dense, MD

## 2023-01-16 ENCOUNTER — Telehealth (HOSPITAL_COMMUNITY): Payer: Self-pay

## 2023-01-16 NOTE — Telephone Encounter (Signed)
Patient reports feeling good. Patient declines questions/concerns about her health and healing.  Patient reports that baby is doing well. Baby sleeps in a bassinet. RN reviewed ABC's of safe sleep with patient. Patient declines any questions or concerns about baby.  EPDS- Patient declines and states that she is feeling good emotionally.   [facility]   01/16/23,1025

## 2023-02-06 ENCOUNTER — Telehealth (HOSPITAL_COMMUNITY): Payer: Self-pay

## 2023-02-06 NOTE — Telephone Encounter (Signed)
Chart review.

## 2023-02-17 DIAGNOSIS — Z1389 Encounter for screening for other disorder: Secondary | ICD-10-CM | POA: Diagnosis not present

## 2023-05-14 DIAGNOSIS — E663 Overweight: Secondary | ICD-10-CM | POA: Diagnosis not present

## 2023-05-14 DIAGNOSIS — F418 Other specified anxiety disorders: Secondary | ICD-10-CM | POA: Diagnosis not present

## 2023-05-14 DIAGNOSIS — Z13220 Encounter for screening for lipoid disorders: Secondary | ICD-10-CM | POA: Diagnosis not present

## 2023-05-14 DIAGNOSIS — Z8632 Personal history of gestational diabetes: Secondary | ICD-10-CM | POA: Diagnosis not present

## 2023-08-02 IMAGING — US US OB TRANSVAGINAL
1 series · 15 of 24 positions shown · non-contrast
Comparison: None.

CLINICAL DATA: Pelvic pain and vaginal bleeding in 1st trimester
pregnancy.

EXAM:
TRANSVAGINAL OB ULTRASOUND
TECHNIQUE: Transvaginal ultrasound was performed for complete evaluation of the
gestation as well as the maternal uterus, adnexal regions, and
pelvic cul-de-sac.

[Series 1: us ob transvaginal · 24 acquisitions, 15 frames shown]
[im 1/24]
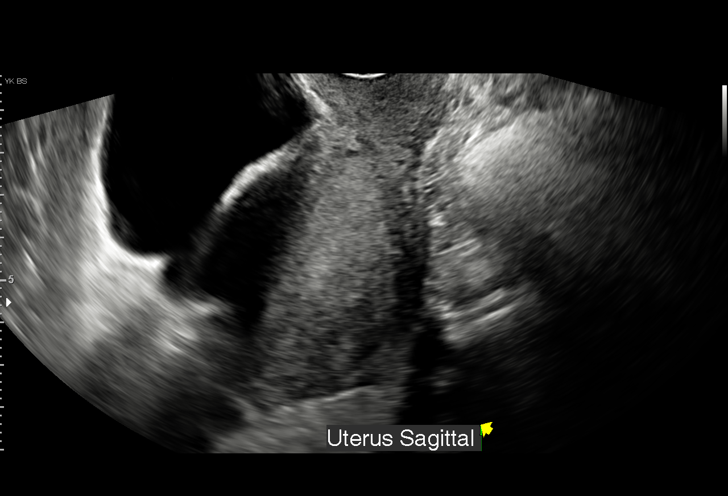
[im 3/24]
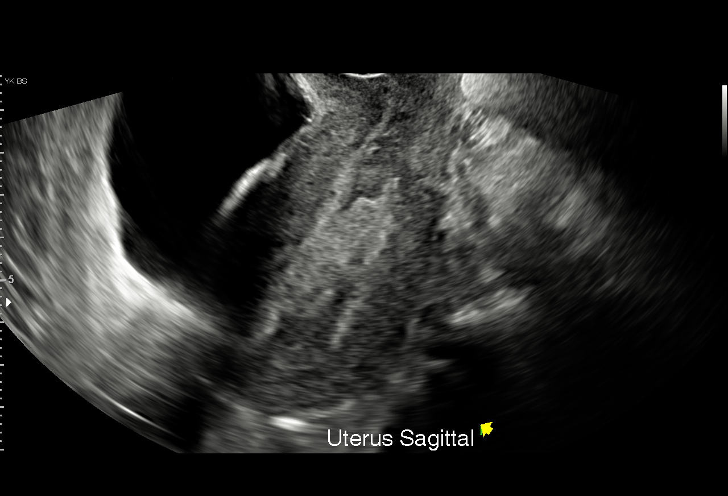
[im 5/24]
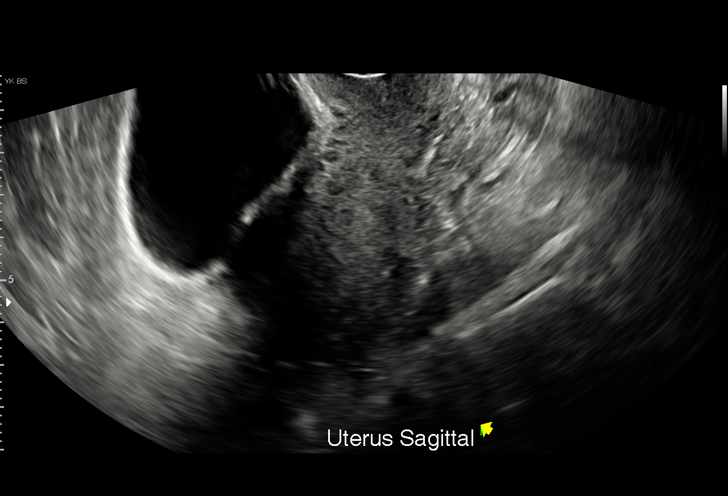
[im 6/24]
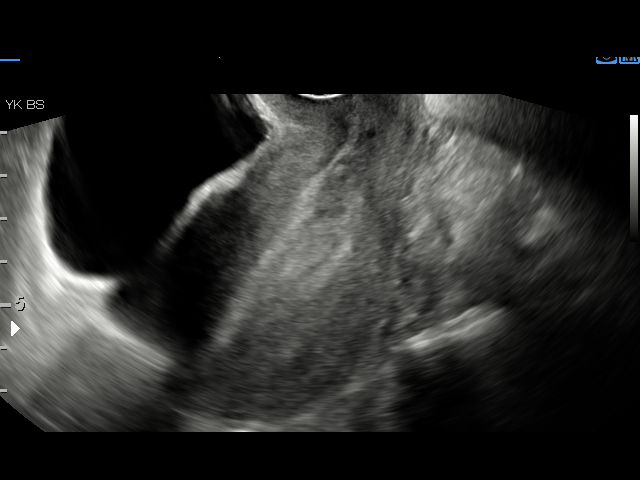
[im 8/24]
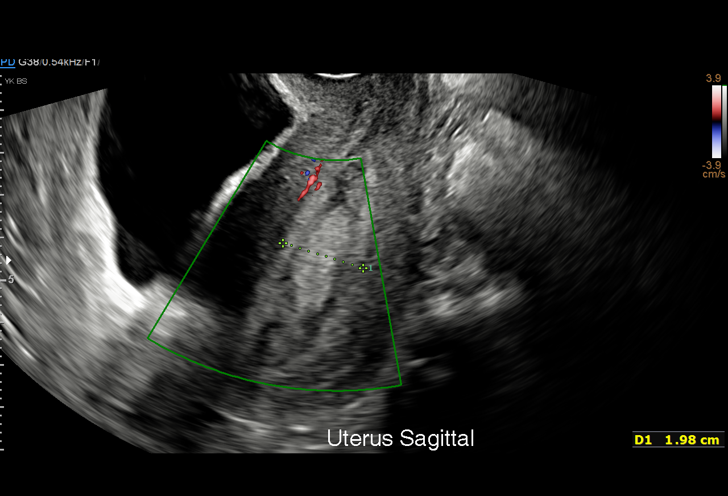
[im 9/24]
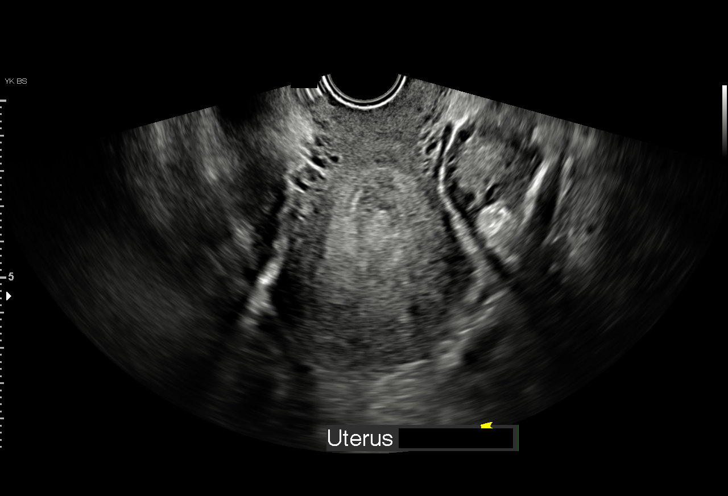
[im 11/24]
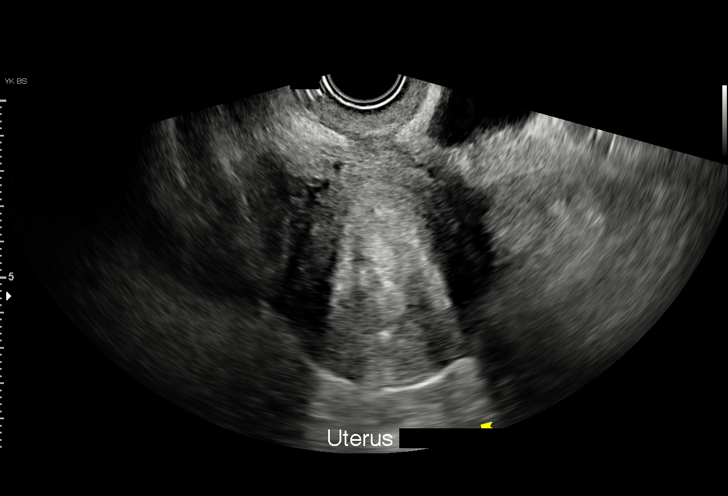
[im 13/24]
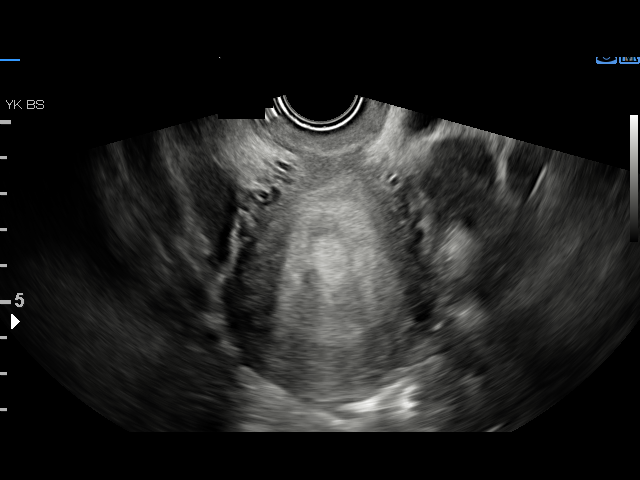
[im 14/24]
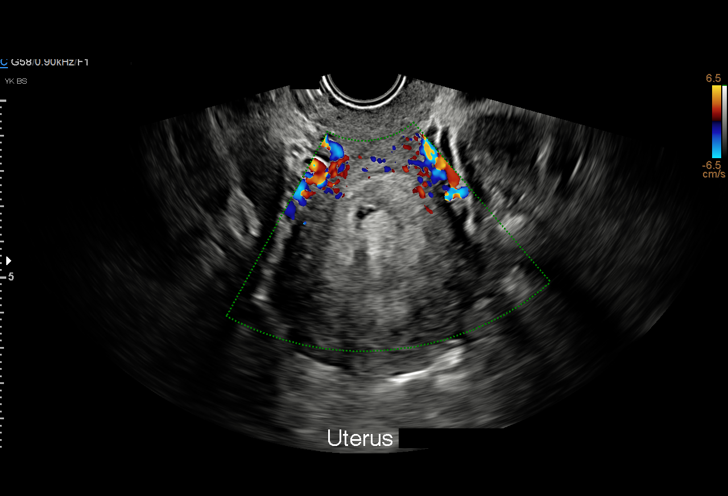
[im 16/24]
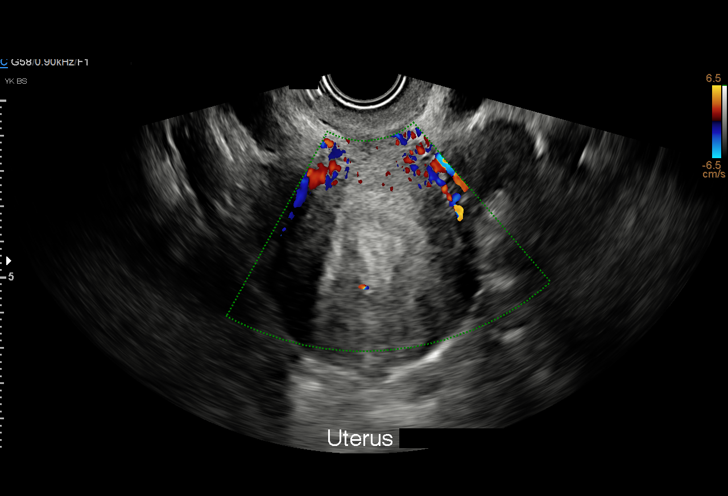
[im 17/24]
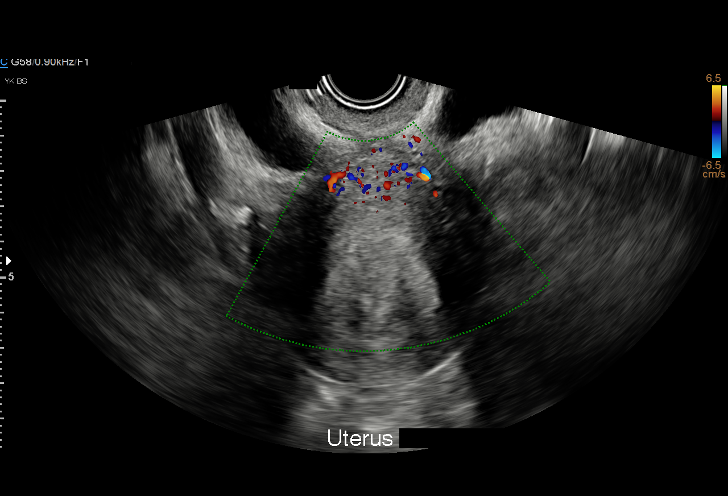
[im 19/24]
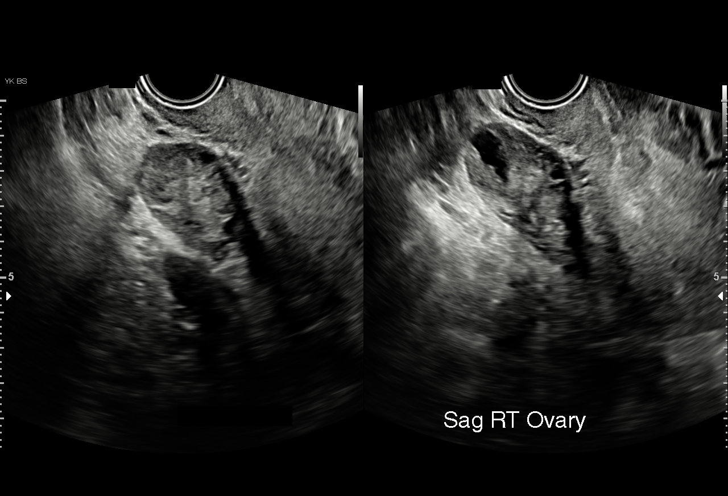
[im 21/24]
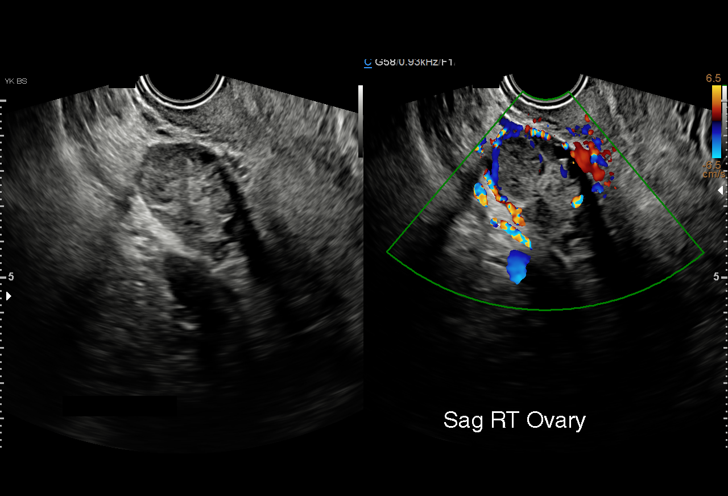
[im 22/24]
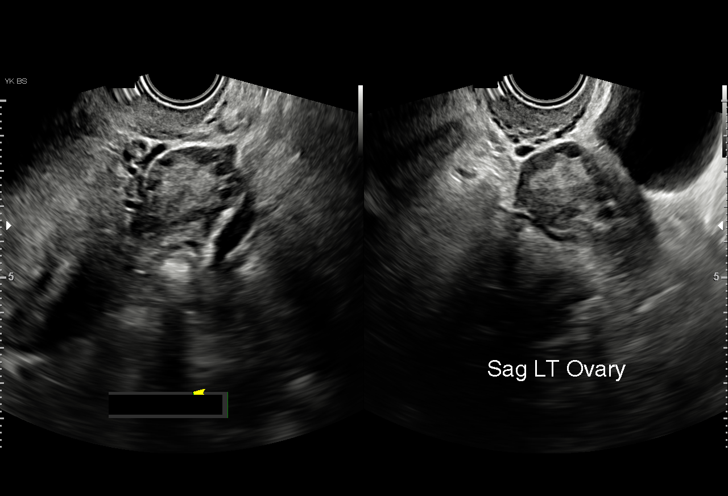
[im 24/24]
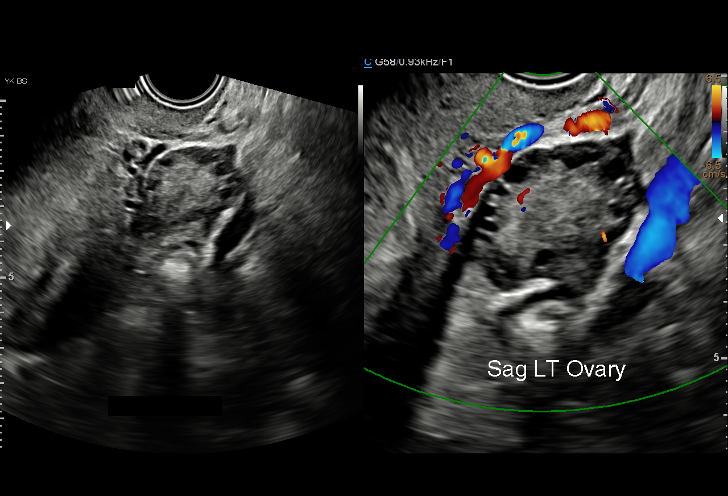

[15 of 24 positions shown; findings below may reference images not displayed]

FINDINGS: Intrauterine gestational sac: None

Maternal uterus/adnexae: Endometrium shows heterogeneous
echogenicity and measures 20 mm in thickness. No fibroids
identified. Both ovaries are normal in appearance. No adnexal mass
or abnormal free fluid identified.
IMPRESSION: Pregnancy of unknown anatomic location (no intrauterine gestational
sac or adnexal mass identified). Differential diagnosis includes
recent spontaneous abortion, IUP too early to visualize, and
non-visualized ectopic pregnancy. Recommend correlation with serial
beta-hCG levels, and follow up US if warranted clinically.
